# Patient Record
Sex: Male | Born: 1945 | Race: White | Hispanic: No | Marital: Married
Health system: Southern US, Community
[De-identification: ages and names within clinical notes are randomized; demographics above are authoritative.]

---

## 2007-10-13 ENCOUNTER — Ambulatory Visit: Payer: Self-pay | Admitting: Internal Medicine

## 2007-10-13 ENCOUNTER — Inpatient Hospital Stay (HOSPITAL_COMMUNITY): Admission: EM | Admit: 2007-10-13 | Discharge: 2007-10-27 | Payer: Self-pay | Admitting: Emergency Medicine

## 2007-10-14 ENCOUNTER — Ambulatory Visit: Payer: Self-pay | Admitting: Cardiology

## 2007-10-14 ENCOUNTER — Encounter: Payer: Self-pay | Admitting: Internal Medicine

## 2007-10-15 ENCOUNTER — Encounter: Payer: Self-pay | Admitting: Cardiology

## 2007-10-16 ENCOUNTER — Encounter: Payer: Self-pay | Admitting: Cardiology

## 2007-10-16 ENCOUNTER — Ambulatory Visit: Payer: Self-pay | Admitting: Cardiothoracic Surgery

## 2007-10-18 ENCOUNTER — Ambulatory Visit: Payer: Self-pay | Admitting: Dentistry

## 2007-10-18 ENCOUNTER — Encounter: Payer: Self-pay | Admitting: Cardiothoracic Surgery

## 2007-10-28 ENCOUNTER — Ambulatory Visit: Payer: Self-pay | Admitting: Cardiology

## 2007-11-02 ENCOUNTER — Ambulatory Visit: Payer: Self-pay | Admitting: Internal Medicine

## 2007-11-02 LAB — CONVERTED CEMR LAB: Prothrombin Time: 102.1 s (ref 10.9–13.3)

## 2007-11-03 ENCOUNTER — Ambulatory Visit: Payer: Self-pay | Admitting: Internal Medicine

## 2007-11-03 LAB — CONVERTED CEMR LAB
INR: 4.7 — ABNORMAL HIGH (ref 0.8–1.0)
Prothrombin Time: 47 s — ABNORMAL HIGH (ref 10.9–13.3)

## 2007-11-08 ENCOUNTER — Ambulatory Visit: Payer: Self-pay | Admitting: Internal Medicine

## 2007-11-09 ENCOUNTER — Ambulatory Visit: Payer: Self-pay | Admitting: Internal Medicine

## 2007-11-15 ENCOUNTER — Ambulatory Visit: Payer: Self-pay | Admitting: Internal Medicine

## 2007-11-18 ENCOUNTER — Ambulatory Visit: Payer: Self-pay | Admitting: Cardiothoracic Surgery

## 2007-11-18 ENCOUNTER — Encounter: Admission: RE | Admit: 2007-11-18 | Discharge: 2007-11-18 | Payer: Self-pay | Admitting: Cardiothoracic Surgery

## 2007-11-22 ENCOUNTER — Ambulatory Visit: Payer: Self-pay | Admitting: Cardiovascular Disease

## 2007-11-22 ENCOUNTER — Ambulatory Visit: Payer: Self-pay | Admitting: Cardiology

## 2007-11-30 ENCOUNTER — Ambulatory Visit: Payer: Self-pay | Admitting: Cardiovascular Disease

## 2007-12-02 ENCOUNTER — Encounter (INDEPENDENT_AMBULATORY_CARE_PROVIDER_SITE_OTHER): Payer: Self-pay | Admitting: *Deleted

## 2007-12-07 ENCOUNTER — Ambulatory Visit: Payer: Self-pay | Admitting: Family Medicine

## 2007-12-07 DIAGNOSIS — I10 Essential (primary) hypertension: Secondary | ICD-10-CM | POA: Insufficient documentation

## 2007-12-07 DIAGNOSIS — I2510 Atherosclerotic heart disease of native coronary artery without angina pectoris: Secondary | ICD-10-CM | POA: Insufficient documentation

## 2007-12-07 DIAGNOSIS — E785 Hyperlipidemia, unspecified: Secondary | ICD-10-CM

## 2007-12-08 ENCOUNTER — Ambulatory Visit: Payer: Self-pay | Admitting: Cardiology

## 2007-12-15 ENCOUNTER — Ambulatory Visit: Payer: Self-pay | Admitting: Cardiology

## 2007-12-16 ENCOUNTER — Ambulatory Visit: Payer: Self-pay | Admitting: Cardiology

## 2007-12-16 LAB — CONVERTED CEMR LAB
ALT: 37 units/L (ref 0–53)
AST: 27 units/L (ref 0–37)
AST: 27 units/L (ref 0–37)
AST: 27 units/L (ref 0–37)
Alkaline Phosphatase: 72 units/L (ref 39–117)
BUN: 20 mg/dL (ref 6–23)
BUN: 20 mg/dL (ref 6–23)
Bilirubin, Direct: 0.1 mg/dL (ref 0.0–0.3)
Calcium: 9.3 mg/dL (ref 8.4–10.5)
Calcium: 9.3 mg/dL (ref 8.4–10.5)
Chloride: 103 meq/L (ref 96–112)
Chloride: 103 meq/L (ref 96–112)
Creatinine, Ser: 1.1 mg/dL (ref 0.4–1.5)
Creatinine, Ser: 1.1 mg/dL (ref 0.4–1.5)
Creatinine, Ser: 1.1 mg/dL (ref 0.4–1.5)
GFR calc Af Amer: 87 mL/min
GFR calc Af Amer: 87 mL/min
GFR calc Af Amer: 87 mL/min
GFR calc non Af Amer: 72 mL/min
GFR calc non Af Amer: 72 mL/min
Glucose, Bld: 86 mg/dL (ref 70–99)
Glucose, Bld: 86 mg/dL (ref 70–99)
HDL: 48.8 mg/dL (ref >39.0)
HDL: 48.8 mg/dL (ref >39.0)
Potassium: 3.8 meq/L (ref 3.5–5.1)
Sodium: 139 meq/L (ref 135–145)
Total Bilirubin: 0.6 mg/dL (ref 0.3–1.2)
Total Bilirubin: 0.6 mg/dL (ref 0.3–1.2)
Total CHOL/HDL Ratio: 3.6
Total Protein: 7 g/dL (ref 6.0–8.3)

## 2007-12-21 ENCOUNTER — Ambulatory Visit: Payer: Self-pay | Admitting: Cardiovascular Disease

## 2007-12-29 ENCOUNTER — Ambulatory Visit: Payer: Self-pay | Admitting: Cardiology

## 2007-12-29 LAB — CONVERTED CEMR LAB
CO2: 30 meq/L (ref 19–32)
Calcium: 9.6 mg/dL (ref 8.4–10.5)
Chloride: 103 meq/L (ref 96–112)
Potassium: 4 meq/L (ref 3.5–5.1)

## 2007-12-30 ENCOUNTER — Ambulatory Visit: Payer: Self-pay | Admitting: Internal Medicine

## 2008-01-10 ENCOUNTER — Ambulatory Visit: Payer: Self-pay | Admitting: Internal Medicine

## 2008-01-13 ENCOUNTER — Ambulatory Visit: Payer: Self-pay | Admitting: Cardiothoracic Surgery

## 2008-06-26 ENCOUNTER — Encounter: Payer: Self-pay | Admitting: *Deleted

## 2008-08-01 ENCOUNTER — Encounter: Payer: Self-pay | Admitting: *Deleted

## 2008-09-04 ENCOUNTER — Encounter: Payer: Self-pay | Admitting: Cardiology

## 2009-03-15 ENCOUNTER — Encounter: Admission: RE | Admit: 2009-03-15 | Discharge: 2009-03-15 | Payer: Self-pay | Admitting: Family Medicine

## 2009-06-20 ENCOUNTER — Ambulatory Visit (HOSPITAL_COMMUNITY): Admission: RE | Admit: 2009-06-20 | Discharge: 2009-06-21 | Payer: Self-pay | Admitting: Cardiovascular Disease

## 2009-09-16 IMAGING — CR DG CHEST 1V PORT
1 series · 1 of 1 positions shown · non-contrast
Comparison: 10/15/2007 study.

CLINICAL DATA: History given of mitral valvular replacement
procedure.

PORTABLE CHEST - 1 VIEW

[view not recorded]
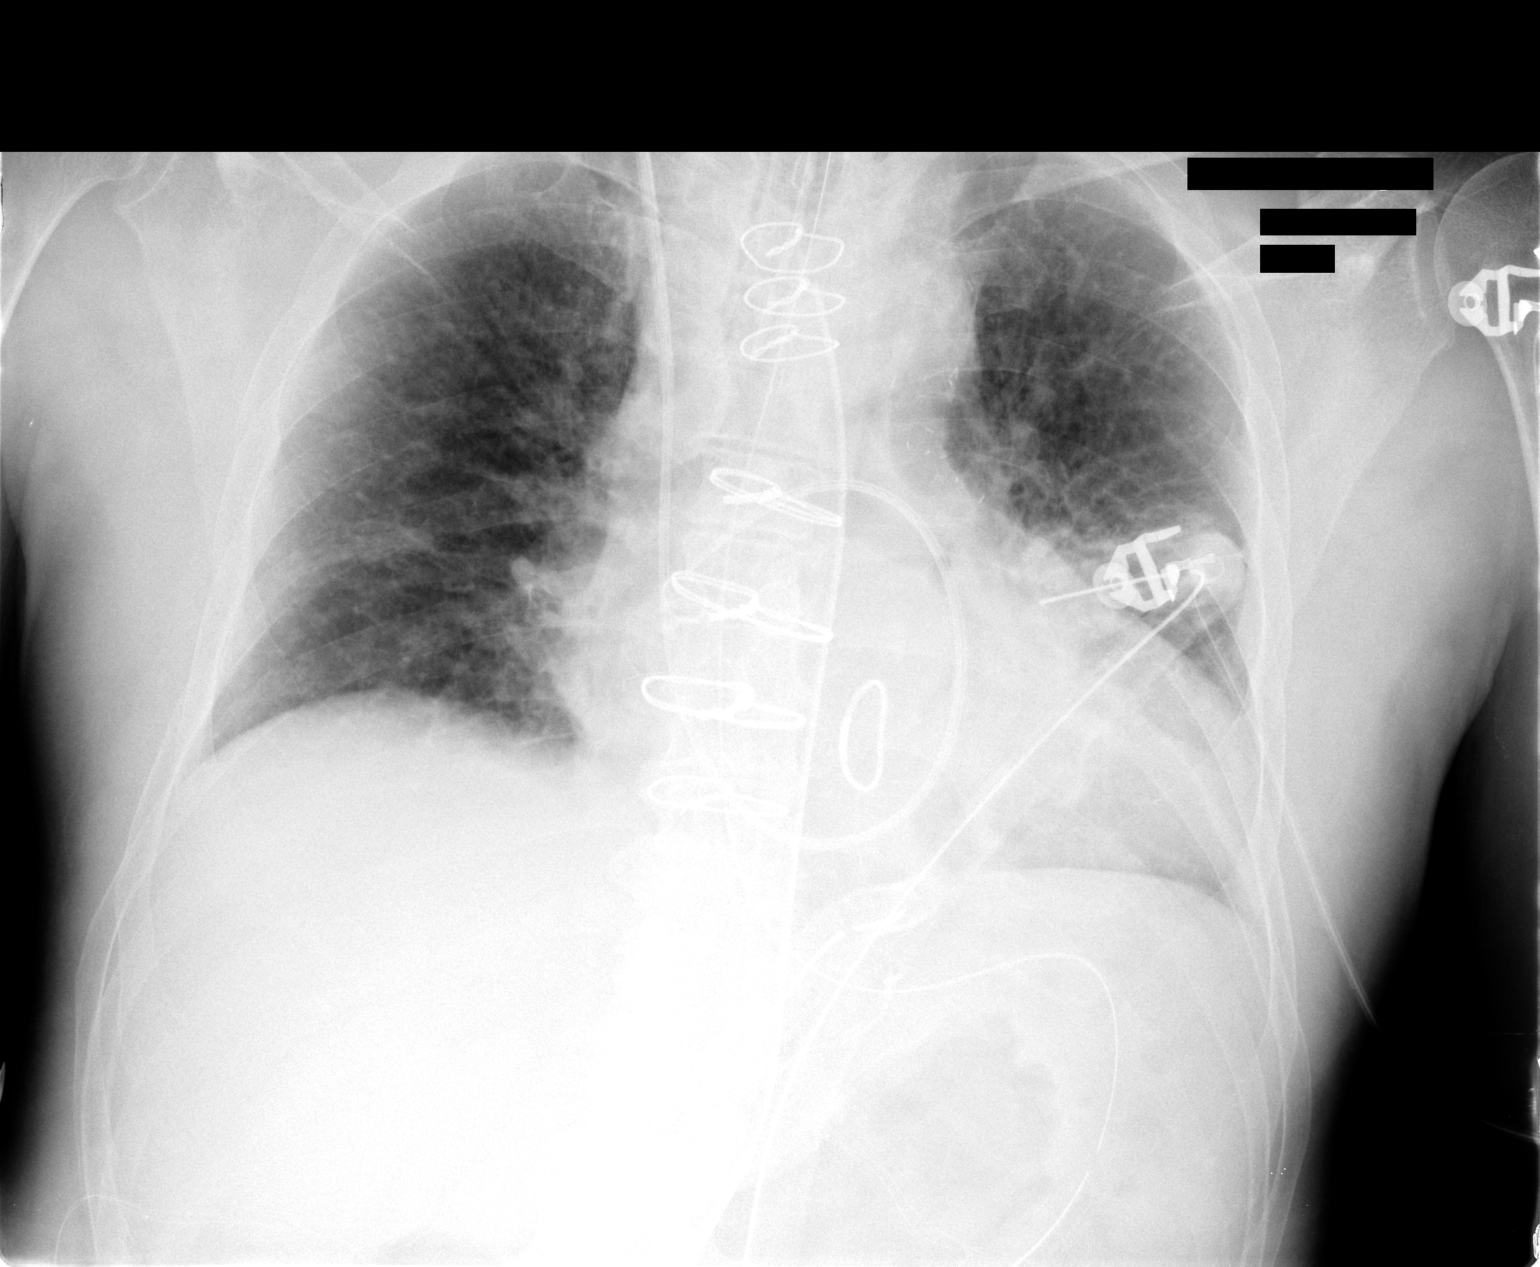

[1 of 1 positions shown; findings below may reference images not displayed]

FINDINGS: Median sternotomy and mitral valvular replacement
procedures have been performed.  Enteric tube is in place with
distal portion in the body of the stomach area but tip is not
included on the image.  Swan-Ganz catheter is in place entering
from right jugular approach.  Its tip is in the right descending
pulmonary artery.  Two left chest tubes are in place .  No
pneumothorax is evident.  There is minimal basilar atelectasis on
the right.  There is elevation of the right hemidiaphragm.
Endotracheal tube is in place with its tip 3 cm above the carina.
Mediastinal drain is in place.
IMPRESSION: Catheter , drain, and tube placements are detailed above. Moderate
enlargement cardiac silhouette.  Status post mitral valvular
replacement procedure. Elevation right hemidiaphragm with minimal
atelectasis in right lung base.

## 2009-09-17 IMAGING — CR DG CHEST 1V PORT
1 series · 1 of 1 positions shown · non-contrast
Comparison: Chest radiograph 10/20/2007

CLINICAL DATA: CABG

PORTABLE CHEST - 1 VIEW

[view not recorded]
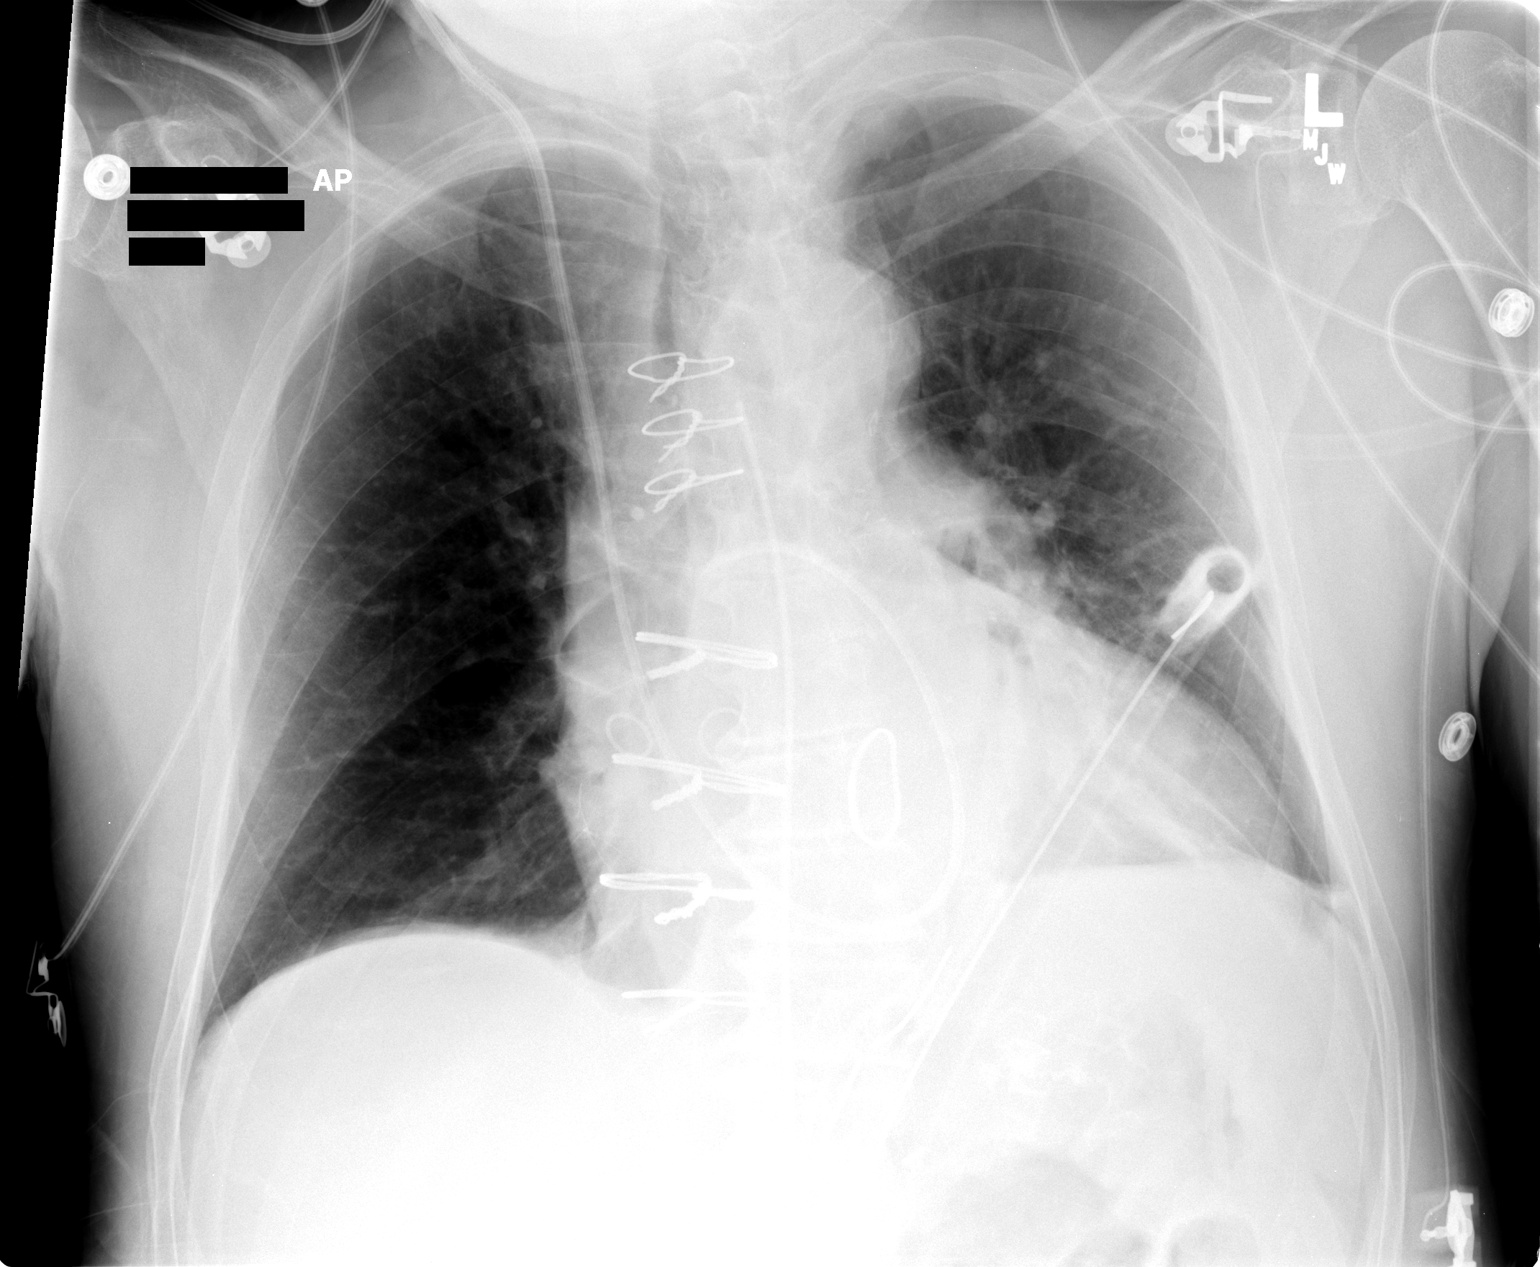

[1 of 1 positions shown; findings below may reference images not displayed]

FINDINGS: Interval extubation with no increase in atelectasis.  The
Swan-Ganz catheter, mediastinal drain, and left chest tube
unchanged.  Plate-like atelectasis at the left lung base unchanged.
There is interval improvement in interstitial edema compared to
prior.
IMPRESSION: 1.  Interval extubation with no increased atelectasis.
2.  Stable support apparatus.
3.  Improved interstitial edema.

## 2009-09-19 IMAGING — CR DG CHEST 2V
2 series · 2 of 2 positions shown · non-contrast
Comparison: 10/22/2007 portable examination.

CLINICAL DATA: Postop CABG.

CHEST - 2 VIEW

[w chest pa]
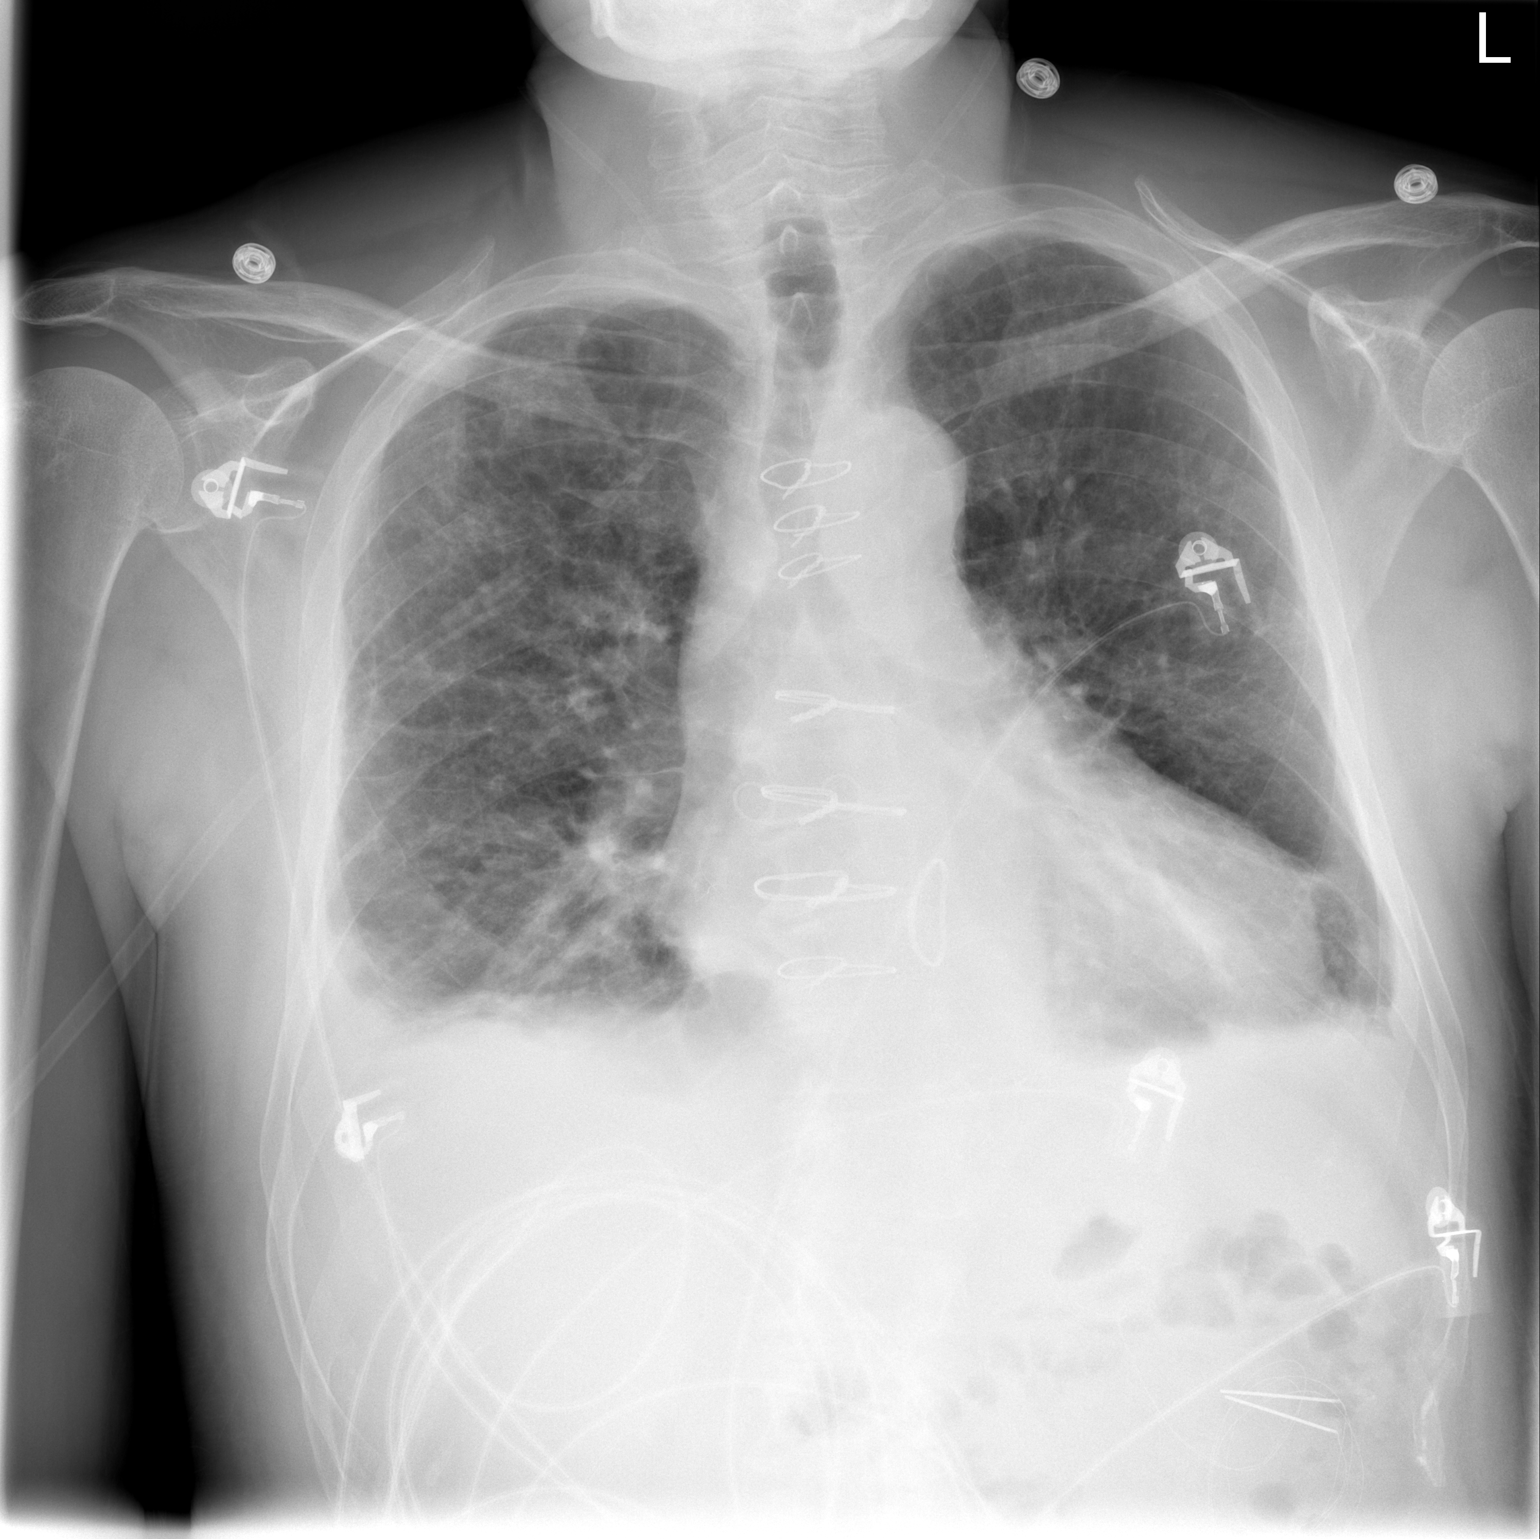

[w chest lat]
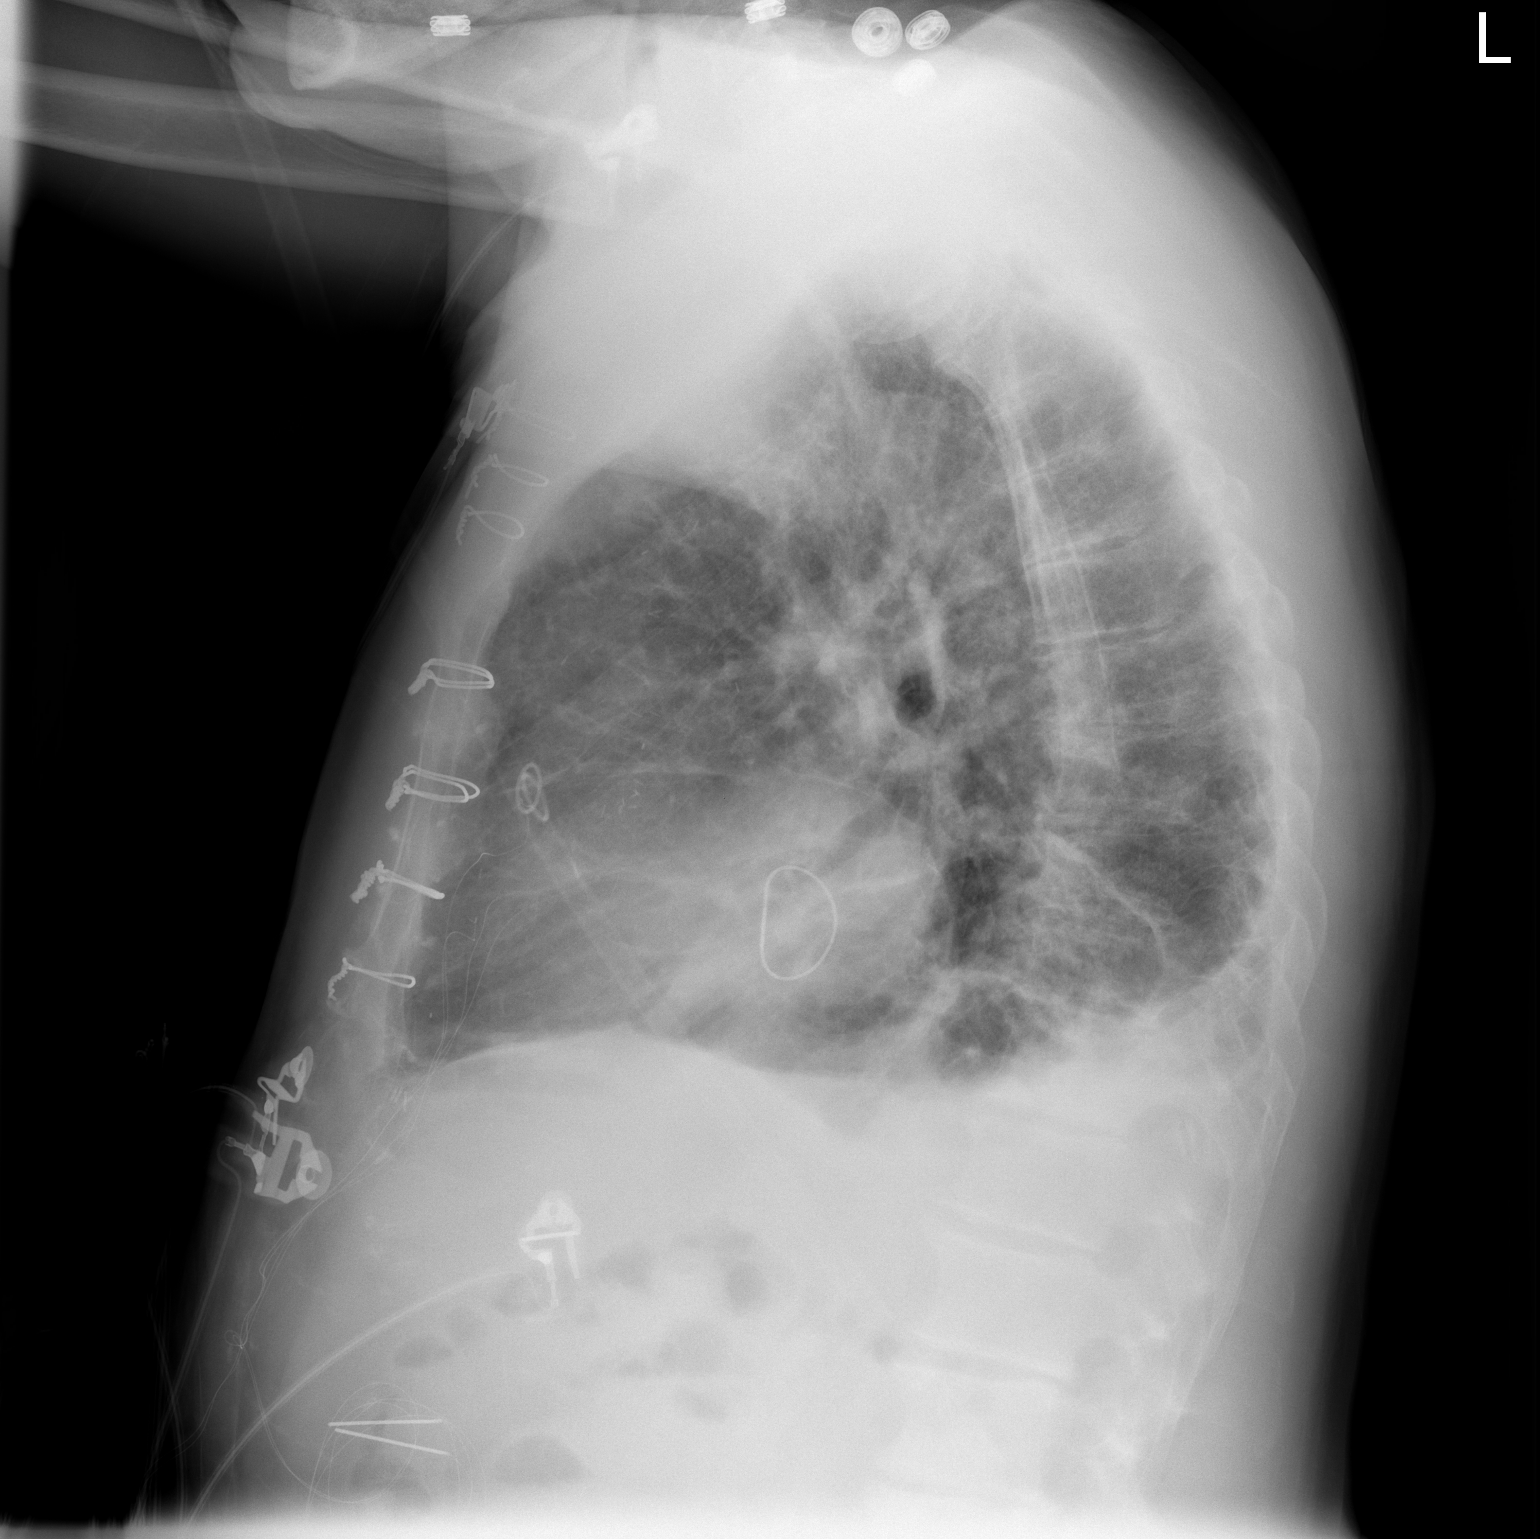

[2 of 2 positions shown; findings below may reference images not displayed]

FINDINGS: Right IJ sheath has been removed in the interval.  There
are persistent small bilateral pleural effusions with associated
bibasilar atelectasis.  The left lower lobe aeration has improved
compared with the prior examination.  There is no pneumothorax.
The heart size and mediastinal contours appear stable.
IMPRESSION: Small bilateral pleural effusions and mild bibasilar atelectasis.
Interval improved aeration of the left lung base.

## 2010-02-16 ENCOUNTER — Encounter: Payer: Self-pay | Admitting: Family Medicine

## 2010-04-14 LAB — CBC
HCT: 40.4 % (ref 39.0–52.0)
MCHC: 34.8 g/dL (ref 30.0–36.0)
MCV: 87.9 fL (ref 78.0–100.0)
Platelets: 179 10*3/uL (ref 150–400)

## 2010-04-14 LAB — COMPREHENSIVE METABOLIC PANEL
Alkaline Phosphatase: 68 U/L (ref 39–117)
Chloride: 104 mEq/L (ref 96–112)
GFR calc Af Amer: >60 mL/min (ref >60)
GFR calc non Af Amer: >60 mL/min (ref >60)
Glucose, Bld: 174 mg/dL — ABNORMAL HIGH (ref 70–99)
Potassium: 3.3 mEq/L — ABNORMAL LOW (ref 3.5–5.1)
Sodium: 139 mEq/L (ref 135–145)
Total Bilirubin: 0.9 mg/dL (ref 0.3–1.2)
Total Protein: 6.6 g/dL (ref 6.0–8.3)

## 2010-06-10 NOTE — Cardiovascular Report (Signed)
NAMEADARIAN, BUR NO.:  [id number]   MEDICAL RECORD NO.:  [id number]          PATIENT TYPE:  INP   LOCATION:  2022                         FACILITY:  [facility]   PHYSICIAN:  Arturo Morton. Riley Kill, MD, FACCDATE OF BIRTH:  1946-01-01   DATE OF PROCEDURE:  10/14/2007  DATE OF DISCHARGE:  10/27/2007                            CARDIAC CATHETERIZATION   INDICATIONS:  Mr. Poplaski is a 65 year old gentleman who presented with  positive enzymes, rapid atrial fibrillation of new onset.  He was seen  by Dr. Ladona Ridgel.  He also has a history of angina.  Dr. Ladona Ridgel evaluated  the patient and recommended cardiac catheterization.  He was brought to  the catheterization laboratory where informed consent was obtained.   PROCEDURES:  1. Left heart catheterization.  2. Selective coronary arteriography.  3. Selective left ventriculography.  4. Distal aortography.   DESCRIPTION OF PROCEDURE:  The procedure was performed for the femoral  artery without complications.  There was elevated blood pressure in the  procedure, and he was given medications to try to control this.   HEMODYNAMIC DATA:  1. Central aortic pressure was 178/89 with a mean of 125.  2. Left ventricular pressure was 158/9 and then 180/15.  There was not      a significant gradient on pullback across the aortic valve.   ANGIOGRAPHIC DATA:  1. The left main ostium was a large ostium that is without critical      narrowing.  This trifurcates into a large ramus intermedius, an      LAD, and a native circumflex vessel.  2. The left anterior descending artery courses to the apex.  The LAD      has some diffuse luminal irregularities.  In the RAO cranial views,      there appears to be probably about 50-60% proximal narrowing of the      LAD.  The remainder of the LAD demonstrate some diffuse luminal      irregularities.  3. The ramus intermedius bifurcates proximally.  There is a superior      branch and an inferior  branch.  The inferior branch has only mild      luminal irregularities.  It is moderately large-caliber vessel,      supplies a good portion of the anterolateral segment and does have      evidence of some proximal irregularity in the bend, but probably no      more than 40%.  The more superior branch is more segmentally and      diffusely diseased and has what appears to be about 80% narrowing      in its midportion.  The vessels, however, overlap rather      significantly.  4. The circumflex proper is totally occluded.  The distal circumflex      appears to be a fairly large-caliber territory, and fills in by      retrograde collateralization.  5. The right coronary artery is a very complex vessel.  There is      extreme tortuosity, extensive throughout the midvessel.  This  supplies the AV vessel, and then supplies a posterior descending-      type branch.  The vessel demonstrates subtotal occlusion in the mid      artery, and then in this area of tortuosity it is extensive, and      certainly not optimal for percutaneous intervention or stenting due      to extreme tortuosity.  Multiple views of this were obtained.      There is also evidence of thrombus in this artery as well.  A      ventriculography in the RAO projection reveals a moderate inferior      wall hypokinesis.  Ejection fraction would be estimated at 50%.      There is at least mild mitral regurgitation.  A distal aortography      demonstrates some diffuse atherosclerotic disease.  The renal      arteries grossly appear to be intact.   IMPRESSION:  1. Noncritical disease of the proximal left anterior descending.  2. Total occlusion of the native circumflex with large distal      circumflex distribution.  3. Highly complex tortuous lesion in the mid right coronary artery.      It is not optimal for percutaneous intervention.  4. Noncritical disease of the left anterior descending.   DISPOSITION:  The patient was  taken back to the CCU for further  evaluation.  We plan to review the films.  We will get a 2-dimensional  echocardiogram.  He did come in with unstable symptoms and rapid atrial  fibrillation.  The RCA is thrombotic, and clearly not ideal for  percutaneous intervention.  Once we have a 2-dimensional echocardiogram,  we will reconsider the options.      Arturo Morton. Riley Kill, MD, [facility]  Electronically Signed     TDS/MEDQ  D:  12/02/2007  T:  12/02/2007  Job:  [id number]

## 2010-06-10 NOTE — Procedures (Signed)
David [name], David [name] NO.:  [id number]   MEDICAL RECORD NO.:  [id number]          PATIENT TYPE:  INP   LOCATION:  2807                         FACILITY:  [facility]   PHYSICIAN:  Nanetta Batty, M.D.   DATE OF BIRTH:  1946-01-19   DATE OF PROCEDURE:  06/20/2009  DATE OF DISCHARGE:                    PERIPHERAL VASCULAR INVASIVE PROCEDURE   PROCEDURE:  Abdominal aortogram and bilateral lower extremity arterial  runoff.   HISTORY:  David [name] is a very pleasant, 65 year old, married, white,  male, father of 3, grandfather of 2 grandchildren who is a retired  Surveyor, minerals referred by Dr. Tracey Harries for peripheral vascular  evaluation.  He has a history of CAD status post bypass grafting and  right and left MAZE procedure with mitral valve ring placed by Dr. Kathlee Nations Trigt in September of 2009.  He had recent Dopplers performed at  radiology revealing ABIs in the 0.7 range, with monophasic waveforms.  He had a negative nuclear study and normal 2-D echo in our office.  He  presents now for angiography and potential endovascular therapy.   DESCRIPTION OF PROCEDURE:  The patient was brought to the second floor  [facility] in the postabsorptive  state.  He was premedicated with p.o. Valium, IV Versed and fentanyl.  His right groin was prepped and shaved in the usual sterile fashion.  Xylocaine 1% was used for local anesthesia.  A 5-French sheath was  inserted into the right femoral artery using standard Seldinger  technique.  It was difficult to access the femoral system and,  therefore, a short 5-French right Judkins catheter was used for  steerability and an 035 angled Glidewire was used to then gain access to  the central aorta.  This was then switched out for an [employer] wire.  A 5-French pigtail catheter was used for abdominal aortography with  bifemoral runoff using bolus chased digital subtraction step-table  technique.   Visipaque dye was used for the entirety of the case.  Retrograde aortic pressure was monitored during the case.   ANGIOGRAPHIC RESULTS:  1. Abdominal aorta:      a.     Renal arteries - Normal.      b.     Infrarenal abdominal aorta - Normal with fluoroscopic       calcification.  2. Left lower extremity:      a.     50% segmental proximal left common iliac artery stenosis.      b.     Total left superficial femoral artery at the origin       reconstituting in Hunter's canal by profunda femoris collaterals.       There is 3-vessel runoff.  3. Right lower extremity:      a.     80% fairly focal right external iliac artery stenosis.      b.     Total right superficial femoral artery at the origin with       reconstitution in Hunter's canal via profunda femoris collaterals       and 3-vessel runoff.   IMPRESSION:  David [name] has  a tight right external iliac which will  need to be percutaneously addressed with occluded superficial femoral  arteries bilaterally which will require femoral-popliteal bypass  grafting.   The patient received 3,000 units of heparin intravenously.  The short 5-  French sheath was exchanged over the [facility] wire for a long 6-French  bright-tipped sheath.  Pressure gradient was obtained over the proximal  right external iliac artery after the administration of 200 mcg of intra-  arterial nitroglycerin via the side-armed sheath which showed no  pullback gradient and, therefore, only the 80% stenosis was addressed.  Following this, predilatation was performed with 5-2 FoxCross and  stenting with a 10 x 4 Smart Nitinol self-expanding stent.  This was  then post dilated with a 7-2 FoxCross resulting in reduction of an 80%  stenosis to 0% residual with excellent flow.  The patient tolerated the  procedure well.  The guidewire was removed.  An ACT was checked and the  sheath was removed.  Pressure was held on the groin in order to achieve  hemostasis.  The patient  tolerated left the lab in stable condition.  He  will be hydrated overnight, discharged home in the morning and treated  with aspirin and Plavix.  Dr. Tracey Harries, his primary care physician,  in [place] was notified of these results.  Once he is discharged home,  he will get follow up Dopplers and ABIs and I will see him back.  At  that time, I will discuss with him surgical options for  revascularization.  He left the lab in stable condition.      Nanetta Batty, M.D.     JB/MEDQ  D:  06/20/2009  T:  06/20/2009  Job:  [id number]   cc:   [facility]  Tracey Harries, M.D.   Electronically Signed by Nanetta Batty M.D. on 07/10/2009 02:56:36 PM

## 2010-06-10 NOTE — Assessment & Plan Note (Signed)
[facility]                            CARDIOLOGY OFFICE NOTE   LAVONTE, PALOS                     MRN:          [id number]  DATE:12/15/2007                            DOB:          [birth year]    [name] is in today for a followup visit.  In general, he thinks, he  has been doing extremely well since the time of his surgery.  He is  slowly and gradually improved.  He denies any ongoing chest pain at the  present time.  He has been seen in primary care which we referred him  for help in management of his hypertension.  He was put on a new therapy  at that time including Zestoretic.  The hydralazine has been started as  an outpatient previously and increased.  He remains on Coumadin  anticoagulation, and the treatment plan has been for about 3 months on  Coumadin with his maze, and mitral valve repair.  His amiodarone  recently has been stopped by Dr. Donata Clay.  He seems to be tolerating  his pravastatin.   At the time of his bypass surgery, the patient underwent bypass graft  surgery x3 with an internal mammary to the LAD, saphenous vein graft to  the obtuse marginal, and saphenous vein graft to the distal right  coronary artery.  We reviewed these films in great detail today.  He had  a highly tortuous right coronary artery was probably the source of the  acute ischemia, and was a terrible vessel for revascularization.  In  addition, he had a totally occluded circumflex, and this was not ideal  for percutaneous intervention and occupied a fairly large segment in the  posterolateral wall.  He also had initial presentation with atrial  fibrillation.   He has been concerned as to whether or not his hypertension was actually  mediated by the surgery.  I have tried to convince and in fact what was  happened since that time has been that his initial presentation was with  hypertension in the emergency room.   MEDICATIONS:  1. Aspirin 81 mg  daily.  2. Coumadin as directed.  3. Hydralazine 50 mg q.8 h.  4. Pravastatin 40 mg daily.  5. Zestoretic 20/25 mg daily.   PHYSICAL EXAMINATION:  Blood pressure 160/110.  The pulses 88.  The  sternotomy is healing well.  The cardiac rhythm is regular.  There is an  S4 gallop.  No rubs are appreciated.   PLAN:  1. Continue to monitor blood pressure at home.  2. We will decrease his hydralazine one-half tablet q.8.  Check      pressures closely and bring a log, return to clinic in 2 weeks.     Arturo Morton. Riley Kill, MD, [facility]  Electronically Signed   TDS/MedQ  DD: 12/29/2007  DT: 12/30/2007  Job #: [id number]

## 2010-06-10 NOTE — Assessment & Plan Note (Signed)
OFFICE VISIT   David [name], David [name]  DOB:  Jun 03, 1945                                        January 13, 2008  CHART #:  [id number]   CURRENT PROBLEMS:  1. Status post coronary artery bypass graft x3, mitral valve      annuloplasty repair, and biatrial maze procedure October 20, 2007.  2. Hypertension.  3. History of paroxysmal atrial fibrillation, currently sinus rhythm.   CURRENT MEDICATIONS:  Aspirin 81 mg, Toprol-XL,  lisinopril/hydrochlorothiazide 20/25 mg, Coreg 6.25 mg b.i.d., Norvasc 5  mg daily, and pravastatin 40 mg a day.   PRESENT ILLNESS:  The patient presents for 22-month followup to discuss  his return to work status after surgical revascularization, mitral valve  repair, and biatrial maze procedure after his presentation with ischemic  MR and recent MI.  He has done very well and he is walking and actually  jogging now.  He has no symptoms of angina or CHF.  He has been off of  his Coumadin and amiodarone and has maintained a sinus rhythm.  He is  planning on going back to work part-time and wishes to discuss this.   PHYSICAL EXAMINATION:  VITAL SIGNS:  Blood pressure 150/88, pulse 80 and  regular, respirations 18, and saturation 98%.  LUNGS:  Breath sounds are clear.  CHEST:  Sternum is well healed.  CARDIAC:  Rhythm is regular without murmur.  EXTREMITIES:  There is no peripheral edema.   A EKG rhythm strip shows sinus rhythm.   RECOMMENDATIONS AND PLAN:  The patient is doing well.  I agree with Dr.  Riley Kill, he could return to work, but to avoid lifting extremely heavy  objects.  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  He knows the importance of good dental hygiene  and antibiotic prophylaxis before any future extractions.  He will  return as needed.   Kerin Perna, M.D.  Electronically Signed   PV/MEDQ  D:  01/13/2008  T:  01/14/2008  Job:  [id number]

## 2010-06-10 NOTE — Discharge Summary (Signed)
David [name], David [name] NO.:  [id number]   MEDICAL RECORD NO.:  [id number]           PATIENT TYPE:   LOCATION:                                 FACILITY:   PHYSICIAN:  Kerin Perna, M.D.  DATE OF BIRTH:  12-26-1945   DATE OF ADMISSION:  10/13/2007  DATE OF DISCHARGE:  10/27/2007                               DISCHARGE SUMMARY   PRIMARY ADMITTING DIAGNOSIS:  Chest pain.   ADDITIONAL/DISCHARGE DIAGNOSES:  1. Non-ST elevation myocardial infarction.  2. Coronary artery disease.  3. Atrial fibrillation.  4. Hypertension.  5. History of tobacco abuse.  6. History of diverticulosis, status post subtotal colectomy.  7. Mitral regurgitation 2+.  8. Peripheral vascular occlusive disease with diminished ABIs      bilaterally.  9. Postoperative mild renal insufficiency.   PROCEDURES PERFORMED:  1. Cardiac catheterization.  2. Coronary artery bypass grafting x3 (left internal mammary artery to      the left anterior descending, saphenous vein graft to the obtuse      marginal, and saphenous vein graft to the distal right coronary      artery).  3. Mitral valve repair with 26-mm Physio II annuloplasty ring.  4. Left atrial appendage.  5. Left to right heart Maze procedures.   HISTORY:  The patient is a 65 year old male who presented to the  emergency department on the date of this admission complaining of chest  discomfort.  He states he awoke on that morning with sudden onset of  shortness of breath, chest pressure, jaw pain, shoulder pain, and  generalized achiness.  He was seen in the emergency department at [facility] and was found to have hypertension and tachycardia with  atrial fibrillation.  He was started on a Cardizem drip with some  improvement in his heart rate, and was seen by Cardiology.  He was  subsequently transferred to [facility] for evaluation and  cardiac workup.   HOSPITAL COURSE:  David [name] was admitted on October 13, 2007.  He  ruled in for a non-ST elevation myocardial infarction.  He underwent a 2-  D echocardiogram, which showed mild-to-moderate reduction in the LV  function with inferior and apical hypokinesis and 2+ MR.  He was  chemically converted back to normal sinus rhythm and underwent  subsequent cardiac catheterization by Dr. Riley Kill, which showed severe  multivessel coronary artery disease, which was not felt to be amenable  to percutaneous intervention.  Because of these findings, a Cardiac  Surgery consult was obtained.  Dr. Kathlee Nations Trigt saw the patient and  reviewed his films, and felt that he would benefit from surgical  revascularization.  Also, after review of his echo, it was felt that he  would probably require a mitral valve repair and a Maze procedure for  his AFib.  He explained all risks, benefits, and alternatives of surgery  to the patient and David [name] agreed to proceed.  His preoperative  workup included carotid Doppler studies, which showed no significant ICA  stenosis bilaterally with decreased ABIs at 0.63 on the right and  0.65  on the left.  He was continued on IV heparin without recurrence of chest  discomfort.  He was able to be taken to the operating room on October 20, 2007, and underwent CABG x3, mitral valve repair, and Maze procedure  as described in detail above.  He tolerated the procedure well and was  transferred to the SICU in stable condition.  He was able to be  extubated shortly after surgery.  He was hemodynamically stable and  doing well on postop day #1.  At that time, his hemodynamic monitoring  devices and chest tubes were removed.  He was kept in the unit for  further observation.  By postop day #2 he was ready for transfer to the  floor.  He was initially bradycardic and required AAI pacing.  He was  also started on very low-dose beta-blocker.  By postop day #3, his pacer  was weaned and discontinued, and he was able to maintain sinus  rhythm  with rates in the 60s to 80s.  His blood pressure has gradually been  trending upward and his beta-blocker dose has been titrated accordingly.  He was also started on an ACE inhibitor, which is also currently being  titrated.  He initially experienced a mild increase in his renal  function with creatinine bumped up to 1.68.  This has trended downward  and has stabilized with creatinine at 1.4.  He has been volume  overloaded postoperatively and has been diuresed with both p.o. Lasix,  IV Lasix, and Zaroxolyn, and is responding well.  He remains  approximately 2 kg above his preoperative weight with mild lower  extremity edema on physical exam.  He has also been anticoagulated  postoperatively and presently his INR is 2.8 with a PT of 31.8.  He is  otherwise progressing well.  He is ambulating with cardiac rehab phase 1  and is making good progress with mobility.  He is tolerating a regular  diet and is having normal bowel and bladder function.  He remains on 1 L  of supplemental oxygen, but sats have been greater than 95%, and it is  anticipated that this will be weaned and discontinued over the next 24  hours.  He has been afebrile and his other vital signs have been stable.   His most recent labs show hemoglobin of 9.3, hematocrit 27.1, platelets  138, and white count 11.6.  Sodium 137, potassium 3.8, BUN 16, and  creatinine 1.4.  Coags as above.  He was anticipated that if he remains  stable over the next 24 hours, he will be ready for discharge home  hopefully on October 27, 2007.   DISCHARGE MEDICATIONS:  1. Coumadin home dose will be determined by PT and INR drawn on the      day of discharge.  2. Aspirin 81 mg daily.  3. Toprol-XL 25 mg daily.  4. Lisinopril 30 mg daily.  5. Crestor 40 mg nightly.  6. Amiodarone 200 mg b.i.d.  7. Lasix 40 mg daily x 5 days.  8. K-Dur 20 mEq daily x 5 days.  9. Tylox 1-2 q.4 h. p.r.n. for pain.   DISCHARGE INSTRUCTIONS:  He is asked  to refrain from driving, heavy  lifting, or strenuous activity.  He may continue ambulating daily and  using his incentive spirometer.  He may shower daily and clean his  incisions with soap and water.  He will continue low-fat, low-sodium  diet.   DISCHARGE FOLLOWUP:  He will  need to make an appointment see Dr. Riley Kill  in 2 weeks.  He will then follow up with Dr. Donata Clay on November 18, 2007, with a chest x-ray from [facility].  A home health nurse  will be arranged for cardiac restorative care and will draw PT and INR  within 48 hours of discharge, and his  Coumadin will be managed by the [facility].  He has been  counseled regarding smoking cessation during his hospitalization and has  been given outpatient resources as well as encouraged to use NicoDerm if  needed.  He will call our office if he experiences any problems or has  questions.      Coral Ceo, P.A.      Kerin Perna, M.D.  Electronically Signed    GC/MEDQ  D:  10/26/2007  T:  10/26/2007  Job:  [id number]   cc:   Arturo Morton. Riley Kill, MD, [facility]  [facility] Office

## 2010-06-10 NOTE — Consult Note (Signed)
David, [name] NO.:  [id number]   MEDICAL RECORD NO.:  [id number]         PATIENT TYPE:  CINP   LOCATION:                               FACILITY:  [facility]   PHYSICIAN:  David [name], D.D.S.DATE OF BIRTH:  1945-06-06   DATE OF CONSULTATION:  10/18/2007  DATE OF DISCHARGE:                                 CONSULTATION   David [name] is a 65 year old male, referred by Dr. Kathlee Nations [name]  for dental consultation.  The patient with recent identification of  severe coronary artery disease and mitral regurgitation.  The patient  was anticipated heart valve surgery with Dr. Donata [name] in the future.  The patient is now seen as part of pre heart valve surgery dental  protocol evaluation.   PAST MEDICAL HISTORY:  1. Coronary artery disease--      a.     Status post non-ST-elevation MI in September 2009.      b.     Status post cardiac catheterization on October 14, 2007,       which revealed severe coronary artery disease and ejection       fraction of approximately 40% and moderate mitral regurgitation       was also noted.      c.     Anticipated coronary artery bypass graft procedure with maze       procedure and possible mitral valve replacement with Dr. Donata [name]       in the future.  2. History of angina.  3. Atrial fibrillation.  4. Hypertension.  5. History of diverticulosis status post subtotal colectomy.  6. Status post right knee replacement.   Allergies - none known.   MEDICATIONS:  1. Aspirin 325 mg daily.  2. Crestor 40 mg every evening.  3. Nicotine patch daily.  4. Protonix 40 mg daily.  5. Toprol-XL 50 mg daily.  6. Imdur 30 mg daily.  7. Lisinopril 20 mg daily.  8. Amiodarone 400 mg twice daily.  9. Advair 1 puff twice daily.  10.Bactroban nasally daily.  11.Heparin IV per protocol.   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  The patient with a smoking history of 49-pack-years.  The patient  with  occasional use of alcohol.   FAMILY HISTORY:  Mother died of unknown causes.  Father died of cancer.   FUNCTIONAL ASSESSMENT:  The patient was independent for ADLs prior to  this admission.   REVIEW OF SYSTEMS:  This is reviewed from the chart and health issue  assessment form for this admission.   CHIEF COMPLAINT:  Dental consultation requested as part of a pre heart  valve surgery dental protocol.   HISTORY OF PRESENT ILLNESS:  The patient is with a history of coronary  artery disease and recent identification of moderate mitral  regurgitation.  The patient with anticipated coronary artery bypass  graft procedure with possible mitral valve replacement.  The patient is  now seen as part of pre heart valve surgery dental protocol evaluation.   The patient currently denies acute toothache swellings or abscesses.  The patient last saw dentist approximately in 1998 and 1999 when an  upper denture was fabricated.  This was in [place].  The patient  indicates that the upper denture fits fine.  The patient knows that he  has several loose teeth.   PHYSICAL EXAMINATION:  GENERAL:  The patient is well-developed, well-  nourished male in no acute distress.  VITAL SIGNS:  The blood pressure is 161/99, pulse rate is 67,  respirations are 18, temperature is 98.5.  NECK:  There is no palpable lymphadenopathy.  There are no acute TMJ  symptoms.   INTRAORAL EXAMINATION:  The patient is edentulous on the maxilla.  The  patient has excessive maxillary right and maxillary left tuberosities.  There is no evidence of abscess formation at this time.   DENTITION:  The patient with remaining teeth numbers 21, 22, 23, 26, 27,  28, and 29.   DENTAL CARIES:  There are dental caries noted as per dental charting  form.   PERIODONTAL:  The patient with chronic advanced periodontal disease with  plaque and calculus accumulations, generalized gingival recession, and  generalized tooth  mobility.   ENDODONTIC:  The patient currently denies acute pulpitis symptoms.  The  patient has had a previous root canal therapy associated with tooth #29.  There is possible incipient periapical pathology associated tooth #21.   CROWN OR BRIDGE:  There are no crown or bridge restorations.   PROSTHODONTIC:  The patient with upper complete denture which fits  fine by patient report.  The upper denture is actually ill fitting.   OCCLUSION:  The patient with a poor occlusal scheme secondary to  multiple missing teeth and lack of replacement of all missing teeth with  dental prostheses.   RADIOGRAPHIC INTERPRETATION:  A panoramic x-ray was taken on October 15, 2007.  The patient has edentulous maxilla.  Tooth numbers 21, 22, 23, 26, 27,  28, and 29 are remaining.  Tooth #29 is a retained root segment.  Dental  caries are noted.  There is moderate to severe bone loss.   ASSESSMENT:  1. History of oral neglect.  2. Chronic periodontitis with bone loss.  3. Generalized gingival recession.  4. Generalized tooth mobility.  5. Multiple missing teeth.  6. Dental caries.  7. Retained root segment #29.  8. Significant tooth mobility remaining.  9. Incipient periapical pathology associated tooth #21.  10.Risk for bleeding with invasive dental procedures due to heparin      therapy.  11.Probable need for antibiotic premedication prior to invasive dental      procedures.  12.Upper complete denture which is less than ideal.   PLAN/RECOMMENDATION:  1. I have discussed risks, benefits, complication, various treatment      options with the patient in relationship to his medical and dental      conditions and anticipated heart and heart valve surgeries.  We      discussed various treatment options to include no treatment, total      and subtotal extractions with alveoloplasty, preprosthetic surgery      as indicated, periodontal therapy, dental restorations, root canal      therapy, crown  or bridge therapy, implant therapy, and replacing      missing teeth as indicated after adequate healing.  The patient      currently wishes to proceed with extraction of all remaining teeth      with alveoloplasty and preprosthetic surgery of the bilateral      tuberosities.  This will be in operating room on Thursday,      October 20, 2007, at 1:30 p.m.  Heparin will be discontinued      prior to invasive dental procedures and antibiotic premedication      will be given.  2. Discussion of findings and coordination of care with Dr. Kathlee Nations      [name] and Dr. Shawnie Pons as indicated.   ADDENDUM: David [name] changed his mind concerning dental treqtment  and admantly refused to proceed with dental treatment before anticipated  heart surgery. Pateint was made awareo fthe riks assocaited with this  decision and will follow-up with the Dentist of his choice for dental  treatment once medically stable.      David [name], D.D.S.  Electronically Signed     RFK/MEDQ  D:  10/18/2007  T:  10/19/2007  Job:  [id number]   cc:   Kerin Perna, M.D.  Arturo Morton. Riley Kill, MD, [facility]

## 2010-06-10 NOTE — Op Note (Signed)
NAMETRIG, MCBRYAR NO.:  [id number]   MEDICAL RECORD NO.:  [id number]          PATIENT TYPE:  INP   LOCATION:  2307                         FACILITY:  [facility]   PHYSICIAN:  Kerin Perna, M.D.  DATE OF BIRTH:  [birth year]   DATE OF PROCEDURE:  10/20/2007  DATE OF DISCHARGE:                               OPERATIVE REPORT   OPERATION:  1. Coronary artery bypass grafting x3 (left internal mammary artery to      left anterior descending, saphenous vein graft to obtuse marginal,      and saphenous vein graft to distal right coronary artery).  2. Left and right atrial Maze procedure.  3. Oversewing of left atrial appendage.  4. Mitral valve repair with a 26-mm Physio-II annuloplasty ring.   SURGEON:  Kerin Perna, MD   ASSISTANT:  Coral Ceo, PA-C   ANESTHESIA:  General.   PREOPERATIVE DIAGNOSIS:  Severe multivessel coronary artery disease with  2+ mitral regurgitation and paroxysmal atrial fibrillation with recent  subendocardial myocardial infarction.   POSTOPERATIVE DIAGNOSIS:  Severe multivessel coronary artery disease  with 2+ mitral regurgitation and paroxysmal atrial fibrillation with  recent subendocardial myocardial infarction.   INDICATIONS:  The patient is a 65 year old gentleman who presented with  paroxysmal atrial fibrillation, chest pain, and positive cardiac  enzymes.  A 2-D echo showed mild-to-moderate reduction in LV function  with inferior and apical hypokinesia and 2+ mitral regurgitation.  Subsequent cardiac catheterization after the patient was chemically  cardioverted back to a regular rhythm showed severe multivessel coronary  artery disease with a 95% stenosis of the right coronary artery, chronic  occlusion of the circumflex, and a 50-60% stenosis of the origin of the  LAD.  LVEF was 50.  He was felt to be a candidate for surgical  revascularization, Maze procedure, and possible mitral valve  annuloplasty repair.  Prior to  surgery, I examined the patient several  times in his CCU room as well as his step-down telemetry room.  I  reviewed the results of the cardiac cath and echo with the patient and  family.  I discussed the indications and expected benefits of coronary  artery bypass surgery, Maze procedure, and mitral valve annuloplasty  repair for treatment of his coronary artery disease, paroxysmal atrial  fibrillation, and mitral regurgitation.  I reviewed the alternatives to  surgical therapy.  I discussed with him the major aspects of the surgery  including location of the surgical incisions, use of general anesthesia  and cardiopulmonary bypass, the plan to use the internal mammary artery  and endoscopically harvested saphenous vein for conduit, and the  expected postoperative hospital recovery.  He also understood the  associated risks of the surgery including the risks of MI, stroke,  bleeding, blood transfusion requirement, infection, pacemaker  requirement, and death.  After discussing these issues, he demonstrated  his understanding and agreed to proceed with surgery as planned under  what I felt was an informed consent.   OPERATIVE FINDINGS:  1. Severe multivessel coronary artery disease with calcified plaque,      mild inferior scar.  2. Mitral valve was structurally intact but with the annular      dilatation and was treated for 2 to 3+ mitral regurgitation is seen      on intraoperative TEE with an annuloplasty ring with complete      resolution of the mitral regurgitation.  3. Left and right atrial ablation lines were performed according to      the Cox - Maze guidelines and the atrial appendage was oversewn.   PROCEDURE:  The patient was brought to the operative room and placed  supine on the operating table.  General anesthesia was induced.  The  transesophageal 2-D echo was placed by the anesthesiologist which  initially showed mild mitral regurgitation.  However, after the   sternotomy had been completed and the mammary was being harvested, the  patient's blood pressure was elevated to 140 and at that time, the  mitral regurgitation became significant and it was decided to proceed  with an annuloplasty repair.  The patient was prepped and draped as a  sterile field, and a sternal incision was made as the saphenous vein was  harvested endoscopically from the right leg.  The internal mammary  artery was harvested as a pedicle graft from its origin at the  subclavian vessels and was a good vessel with adequate flow.  Sternal  retractor was placed, and heparin was administered.  The ACT was  documented as being therapeutic.  Pursestrings were placed in the  ascending aorta and right atrium and after the vein had been harvested  and inspected and found be adequate, the patient was cannulated and  placed on bypass.  A second pursestring was placed low on the right  atrium for bicaval venous drainage.  The interatrial groove was  dissected and caval tapes were placed around the SVC and IVC.  The  coronaries were identified for grafting, and a cardioplegic catheter was  placed in the ascending aorta connected to the vent.  The patient was  cooled to 30 degrees.  The mammary artery and vein grafts were prepared  for the distal anastomoses and cross-clamp was applied.  An 800 mL of  cold blood cardioplegia was delivered through the aortic root with good  cardioplegic arrest and septal temperature dropped less than 15 degrees.  Cardioplegia was then delivered every 20 minutes or less while the cross-  clamp was applied.   The Maze procedure was then performed partially at this point.  The  bipolar radiofrequency ablation clamp was placed across the atrial  appendage to create an ablation line.  Next, the ligament of Gaynell Face  between the left superior pulmonary vein and the left pulmonary artery  was opened and the left-sided pulmonary veins were encircled with vessel   loop.  The bipolar radiofrequency clamp was then placed around the cuff  of the left pulmonary veins at the junction of the left atrium and an  ablation line was created.   The distal coronaries were then performed after a redose of  cardioplegia.  The first distal anastomosis was to the circumflex branch  of the left coronary.  This was totally occluded.  It was a 1.5 mm  vessel.  A reverse saphenous vein was sewn end-to-side with a running 7-  0 Prolene with good flow through the graft.  The second distal  anastomosis was to the distal right.  This was a smaller 1.2-mm vessel  with proximal 95% stenosis.  A reverse saphenous vein was sewn end-to-  side with  a running 7-0 Prolene with good flow through the graft.  Cardioplegia was redosed.  The third distal anastomosis was to the  distal third of the LAD.  The LAD had some diffuse plaque and a proximal  ostial 50% stenosis.  The left IMA pedicle was brought through an  opening created in the left lateral pericardium, was brought down onto  the LAD and sewn end-to-side with a running 8-0 Prolene.  There was  excellent flow through the anastomosis after briefly releasing the  pedicle bulldog on the mammary artery.  The bulldog was reapplied and  the pedicle was secured to epicardium.  Cardioplegia was redosed.   A left atriotomy incision was then made in the interatrial groove.  The  atrial retractors were positioned.  Exposure of the atrium was good.  Next, the bipolar radiofrequency device was then used to create an  ablation line posteriorly around the pulmonary veins on the right side  at the junction of the left atrium.  This was placed by using the  bipolar radiofrequency ablation clamp from below and as well as above.  This completed the encircling lesion around the right-sided veins.  Next, the bipolar device was used to make an ablation line connecting  the right-sided encircling ablation line to the left-sided ablation line   across the back of the left atrial wall.  A  final bipolar ablation line  was then created from the atrial incision up to the mitral valve at the  P3 segment.  Finally, the unipolar ablation device - pen was used to  connect the ablation line around the atrial appendage down to the left-  sided pulmonary vein ablation line.  After this, the atrial appendage  was oversewn with a running 3-0 Prolene from the inside.  This completed  the Maze procedure on the left atrium.  Cardioplegia was redosed.   Next, attention was directed to the mitral valve.  It was tested with  saline and there is a central regurgitant jet.  Annuloplasty sutures of  2-0 Ethibond were placed around the annulus numbering 14 total.  The  anterior leaflet was sized to a 26-mm Physio-II ring.  The sutures were  placed through the Physio-II ring (serial number [id number], model 5200)  and the ring was seated and the sutures were tied.  The valve was then  tested and was completely competent.  The left atriotomy incision was  then closed using a running 4-0 Prolene and a Foley catheter was left  through the valve into the left ventricle to serve as a vent for removal  of air later.  Cardioplegia was redosed.   The right atrium was then opened with a vertical incision across the  crista terminalis.  The Maze ablation lines were then created using the  bipolar clamp from this incision superiorly towards the SVC and from the  incision inferiorly towards the IVC.  Next, a bipolar incision was made  from the superior aspect of the atrial incision down to the tricuspid  valve.  Next, an unipolar ablation line was created from the inferior  IVC ablation line up to the coronary sinus.  This completed the right-  sided Maze ablation procedure.  The atriotomy was then closed with a  running 4-0 Prolene in 2 layers.  Cardioplegia was redosed.   While the cross-clamp was still applied, 2 proximal vein anastomoses  were performed on the  ascending aorta using a 4.5-mm punch in running 7-  0 Prolene.  Best boy  was vented from the coronaries with usual de-airing  maneuvers before the final proximal anastomosis was tied.  The cross-  clamp was then removed.   The heart was cardioverted back to a regular rhythm.  The mammary artery  was opened and the vein grafts were opened and each had good flow and  hemostasis was documented to the proximal distal sites.  The patient was  rewarmed and reperfused.  Temporary pacing wires were applied.  The  atriotomy incisions were completed and were hemostatic.  When the  patient had been adequately rewarmed and reperfused, the ventilator was  resumed.  The patient was then weaned from bypass on a low-dose dopamine  and milrinone.  Cardiac output and blood pressure were stable.  Protamine was administered without adverse reaction.  The TEE showed the  mitral valve to be competent and global LV function was well preserved.  The patient was in a sinus rhythm.  Protamine was administered without  adverse reactions.  The cannulas were removed.  The mediastinum was  irrigated with warm saline.  The superior pericardial fat was closed.  The platelet count came back well low at 90,000.  The patient was given  platelets and FFP with improved coagulation function.  Two mediastinal  and a left pleural chest tube were placed and brought out through  separate incisions.  The sternum was closed with interrupted steel wire.  The pectoralis fascia was closed with a running #1 Vicryl.  The  subcutaneous and skin layers were closed in running Vicryl and sterile  dressings were applied.  Total bypass time was 224 minutes.      Kerin Perna, M.D.  Electronically Signed     PV/MEDQ  D:  10/20/2007  T:  10/21/2007  Job:  [id number]   cc:   Arturo Morton. Riley Kill, MD, [facility]

## 2010-06-10 NOTE — Assessment & Plan Note (Signed)
OFFICE VISIT   David [name], David [name]  DOB:  11/04/1945                                        November 18, 2007  CHART #:  [id number]   CURRENT PROBLEMS:  1. Status post coronary artery bypass graft x3, mitral valve      annuloplasty repair, and left and right atrial maze procedure on      10/20/2007.  2. Hypertension.  3. History of paroxysmal atrial fibrillation, now sinus rhythm post      coronary artery bypass graft, mitral valve repair, and maze      procedure.   CURRENT MEDICATIONS:  Amiodarone 200 mg b.i.d., Coumadin as directed per  [facility], aspirin 81 mg a day, Toprol-XL 25 mg,  hydrochlorothiazide 25 mg daily, Zocor 40 mg daily, lisinopril 20 mg  b.i.d., and the vitamins.   PRESENT ILLNESS:  The patient returns for his first office visit after  undergoing cardiac surgery on 10/18/2007.  He is doing well without  recurrent symptoms of angina or CHF.  He has remained in sinus rhythm.  The surgical incisions are healing well.  He has had no bleeding  complications from his Coumadin therapy initiated after his diagnosis of  atrial fibrillation on admission.  His weight has been stable and he is  walking daily.  He was recently checked at the [facility] where he  was hypertensive and he was started on hydrochlorothiazide.   PHYSICAL EXAMINATION:  VITAL SIGNS:  Blood pressure 160/98, pulse 74,  respirations 18, and saturation 97%.  GENERAL:  He is alert and pleasant.  LUNGS:  Breath sounds are clear and equal.  The sternum is stable.  CARDIAC:  Rhythm is regular.  There is no murmur, rub, or gallop.  ABDOMEN:  Soft without edema.  EXTREMITIES:  The leg incision is healing well.  There is no peripheral  ankle edema.   A PA and lateral chest x-ray reveals stable cardiac silhouette, slight  blunting of left costophrenic angle with no significant pleural  effusion, and changes of COPD from his long history of smoking.  There  is no  evidence of edema or infiltrate.   RECOMMENDATIONS:  The patient will increase his Toprol-XL to 50 mg once  a day due to his persistent hypertension.  He will reduce the amiodarone  to once a day.  He will continue the Coumadin for probably at least 3  months, although leave this decision to Dr. Riley Kill.  He knows he can  resume driving light activities, but to avoid lifting more than 20  pounds until 3 months after surgery.  He will proceed with outpatient  cardiac rehabilitation next week as scheduled.   No prescriptions were provided this office visit.  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   Kerin Perna, M.D.  Electronically Signed   PV/MEDQ  D:  11/18/2007  T:  11/18/2007  Job:  [id number]   cc:   Arturo Morton. Riley Kill, MD, [facility]

## 2010-06-10 NOTE — Assessment & Plan Note (Signed)
[facility]                            CARDIOLOGY OFFICE NOTE   David [name], David [name]                       MRN:          [id number]  DATE:11/09/2007                            DOB:          May 21, 1945    PRIMARY CARDIOLOGIST:  Arturo Morton. Riley Kill, MD, [facility]   [name] is a 65 year old gentleman who presented to [facility]  with a non-ST-elevation MI.  He was found to have three-vessel coronary  artery disease in addition to 2+ MR and atrial fibrillation.  He  underwent CABG x3 on October 20, 2007, with a LIMA to the LAD, SVG to  the OM, and SVG to the distal RCA.  He also had left and right atrial  maze procedure, oversewing of the left atrial appendage, and mitral  valve repair with a 26-mm Physio 2 annuloplasty ring by Dr. Donata Clay.  He was placed on Coumadin and amiodarone for postop atrial fibrillation  as well.   Since the patient has been home, he is recovering quite well.  His main  complaint is fatigue.  He is able to walk 5 minutes, sometimes 3 times a  day, but other days just once a day.  He is not willing to joint cardiac  rehab at this time.  He denies any dyspnea on exertion, chest pain,  dizziness, or presyncope.  He is quite concerned because his blood  pressure is elevated more than usual at home, but his wife states that  he is very tense and has had actually 3 blow ups since he has been  home.  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  He has quit smoking.  He is currently wearing a nicotine patch and I  congratulated him for this.  I spent at least 30 minutes speaking to  them about the different medications he is on and the procedures he went  through and encouraged him to enroll in cardiac rehab to get him back on  his feet sooner than later.   CURRENT MEDICATIONS:  1. Aspirin 81 mg daily.  2. Coumadin as directed.  3.  Toprol-XL 25 mg daily.  4. Lisinopril 30 mg daily.  5. Crestor over 40 mg nightly.  6. Amiodarone 200 mg t.i.d.   PHYSICAL EXAMINATION:  GENERAL:  This is a pleasant 65 year old white  male in no acute distress.  VITAL SIGNS:  Blood pressure 177/99, pulse 69, weight 162.  NECK:  Without JVD, HJR, bruit, or thyroid enlargement.  LUNGS:  Decreased breath sounds, but they are clear anterior, posterior,  and lateral.  HEART:  Regular rate and rhythm at 70 beats per minute, normal S1 and  S2, no murmur, rub, bruit, thrill, or heave appreciated.  ABDOMEN:  Soft without organomegaly, masses, lesions, or abnormal  tenderness.  EXTREMITIES:  Without cyanosis, clubbing, or edema.  He has good distal  pulses.  All incisions are healing well.   EKG, normal sinus rhythm, inferior Q waves.   IMPRESSION:  1. Coronary artery disease status post coronary artery bypass grafting      x3 on October 20, 2007, with a left internal mammary artery to      the left anterior descending, saphenous vein graft to the obtuse      marginal, saphenous vein graft to the distal right coronary artery.  2. Left and right atrial maze procedure.  3. Oversewing of left atrial appendage.  4. Mitral valve repair with a 26-mm Physio 2 annuloplasty ring.  5. Status post non-ST elevation myocardial infarction on October 13, 2007.  6. Atrial fibrillation, currently in normal sinus rhythm.  7. History of tobacco abuse, has quit.  8. Hypertension.  9. History of diverticulosis status post subtotal colectomy.  10.Peripheral vascular occlusive disease with diminished ABIs      bilaterally.  11.Stress and anxiety.   PLAN:  I have increased David [name]'s lisinopril to 40 mg daily.  I have  added hydrochlorothiazide 25 mg daily for his hypertension.  I have  changed his Crestor to pravastatin 40 mg daily because he cannot afford  the Crestor.  I have encouraged him to follow 2-g or less sodium diet  daily.  I have  asked him to call if his blood pressure remains elevated.  I have encouraged increase in his exercise program and highly  recommended cardiac rehab to help reduce some of his stress and anxiety  and get him back on his feet again.  He sees Dr. Donata Clay next week and  will see Dr. Riley Kill back in 2 months.      Jacolyn Reedy, PA-C  Electronically Signed      Luis Abed, MD, [facility]  Electronically Signed   ML/MedQ  DD: 11/09/2007  DT: 11/09/2007  Job #: [id number]

## 2010-06-10 NOTE — Consult Note (Signed)
[name], [name] NO.:  [id number]   MEDICAL RECORD NO.:  [id number]          PATIENT TYPE:  INP   LOCATION:                               FACILITY:  [facility]   PHYSICIAN:  Kerin Perna, M.D.  DATE OF BIRTH:  Jun 14, 1945   DATE OF CONSULTATION:  10/15/2007  DATE OF DISCHARGE:                                 CONSULTATION   REQUESTING PHYSICIAN:  Arturo Morton. Riley Kill, MD, [facility]   REASON FOR CONSULTATION:  Severe multivessel coronary artery disease  with acute subendocardial MI and paroxysmal atrial fibrillation.   HISTORY OF PRESENT ILLNESS:  I was asked to evaluate this 65 year old  white male for possible surgical coronary revascularization with  combined maze procedure for recently diagnosed severe multivessel  coronary disease with an acute MI, mild-to-moderate reduction in LV  function, and occurrence of paroxysmal atrial fibrillation.  The patient  had sudden onset of chest and jaw pain associated with shortness of  breath on the day of admission, October 13, 2007.  The patient  presented to the [facility] ER where his blood pressure was elevated at  170/110, and he had heart rate 160 and rapid atrial fibrillation.  He  had associated chest pain and was started on IV Cardizem drip.  His  heart rate was reduced to low in 100s.  The patient denied any  palpitations during this time.  He has no prior history of cardiac  disease, arrhythmia, or other medical problems and does not have a  primary care physician.   ADMISSION DIAGNOSES:  1. Acute myocardial infarction by positive cardiac enzymes with a peak      CPK-MB of 50.2 ng/mL.  2. Paroxysmal atrial fibrillation.  3. Hypertension.   HOME MEDICATIONS:  None.   ALLERGIES:  None.   SURGICAL HISTORY:  Right knee replacement and partial colectomy for  diverticulitis over 30 years ago.   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  He smokes 1  pack of cigarettes a day (50-pack-years).   Occasional alcohol intake.   FAMILY HISTORY:  Positive for heart disease and cancer.   REVIEW OF SYSTEMS:  He has had multiple injuries to his extremities that  did not require surgery.  He denies any significant chest injuries.  He  denies any active dental problems or difficulty swallowing.  He is a  chronic smoker and has a chronic cough but no sputum production.  He has  had no recurrence of diverticulitis since his colectomy 30 years ago.  He has had claudication in his right leg but denies a DVT.  He denies  diabetes or thyroid disease.  He denies any prior TIA, stroke, or  seizure.  He denies any prior blood transfusion or bleeding disorder.  He is unsure of his cholesterol level.   PHYSICAL EXAMINATION:  VITAL SIGNS:  He is 5 feet 8.5 inches and weighs  165 pounds.  Blood pressure 140/70.  Pulse is currently in sinus rhythm,  rate 78 per minute.  GENERAL APPEARANCE:  That of a middle-aged white male in the CCU, in no  acute distress.  HEENT:  Normocephalic.  Dentition adequate.  NECK:  Without JVD, mass, or bruit.  LYMPHATICS:  No palpable cervical or supraclavicular adenopathy.  THORAX:  Without deformity, and breath sounds are clear.  CARDIAC:  Regular rhythm without S3 gallop or murmur.  Peripheral pulses  are 2+ in all extremities.  There is no clubbing or cyanosis or edema.  NEUROLOGIC:  Nonfocal.  ABDOMEN:  Soft without pulsatile mass.   LABORATORY DATA:  I reviewed his coronary arteriogram and his 2D echo.  He has severe disease of his right coronary and circumflex systems with  mild disease of the LAD and ramus branch of the left coronary.  His LVEF  is 50%.  His echo shows LVH with some 1-2+ mitral regurgitation although  this was difficult to evaluate due to the ectopy during the echo.  There  is no significant left atrial enlargement.   IMPRESSION AND PLAN:  The patient's coronary anatomy would be suited  optimally for surgical revascularization with combined  maze procedure.  A TE intraoperatively would assess his mitral valve, what appears to be  mild, although it is difficult to assess the severity of the mitral  regurgitation on the chest wall echo due to ectopy during the exam.  Surgical revascularization of the distal right coronary, the circumflex,  and possibly the left anterior descending - ramus should provide him  with long-term therapy for his significant coronary disease.  An  associated maze procedure would hopefully prevent the long-term Coumadin  requirement.  This was discussed with the patient, and I have further  reviewed the situation with his wife who is a Charity fundraiser.  Thank you for the  consultation.      Kerin Perna, M.D.  Electronically Signed     PV/MEDQ  D:  10/15/2007  T:  10/15/2007  Job:  [id number]

## 2010-06-10 NOTE — H&P (Signed)
NAMENICHOLUS, David [name]              ACCOUNT NO.:  [id number]   MEDICAL RECORD NO.:  [id number]          PATIENT TYPE:  INP   LOCATION:  1225                         FACILITY:  [facility]   PHYSICIAN:  Doylene Canning. Ladona Ridgel, MD    DATE OF BIRTH:  1945/12/31   DATE OF ADMISSION:  10/13/2007  DATE OF DISCHARGE:                              HISTORY & PHYSICAL   PRIMARY CARDIOLOGIST:  Will be new.  Doylene Canning. Ladona Ridgel, MD   PRIMARY CARE PHYSICIAN:  The patient does not have one.   HISTORY OF PRESENT ILLNESS:  This is a 65 year old Caucasian male with  no prior history of coronary artery disease, who awoke this a.m. with  sudden onset of complaints of shortness of breath and panting with  pain and pressure in his chest, jaw pain, shoulder pain, stomach pain,  and generalized achiness, which he described as flu-like symptoms.  He  waited about 30 minutes and did not feel better, so he asked his wife to  drive him to [employer].  Upon arrival to [facility], the patient  had a blood pressure of 170/110 and a heart rate of 163 beats per  minute, was found to be in AFib RVR.  The patient states that he felt as  if someone was sitting on his chest.  He was started on Cardizem drip,  and pain and pressure were relieved.  His heart rate is better  controlled now between 100 and 110, and the patient is more comfortable.  He could not tell that this heart rate was during this episode this  morning.  Otherwise, the patient had no prior symptoms and is now being  followed by a physician regularly.   REVIEW OF SYSTEMS:  Sudden onset of chest pain, shortness of breath,  also some symptoms of intermittent claudication on the right.  The  patient also had some mild nausea and diaphoresis.   PAST MEDICAL HISTORY:  None.   SOCIAL HISTORY:  He lives in [place] with his wife.  He is an  unemployed Music therapist.  He is a 49-pack-year smoker.  Occasional EtOH.  No drug use.   PAST SURGICAL HISTORY:  Right knee  replacement and he had a colonoscopy  for diverticulitis.   FAMILY HISTORY:  Mother died in her sleep.  Father deceased of cancer,  but also had a history of heart disease.  He has 2 sisters, who are in  good health.   CURRENT MEDICATIONS AT HOME:  None.   CURRENT ALLERGIES:  None.   LABORATORIES:  Hemoglobin 16.6, hematocrit 48.8, white blood cells 8.6,  and platelets 243.  CK 166, MB 9.7, and troponin less than 0.05.  PTT  28, PT 13.2, and INR 1.0.   Chest x-ray revealing mild cardiomegaly with no active lung disease.  EKG revealing atrial fibrillation with ventricular rate of 163 beats per  minute with ischemic changes noted with T-wave depression in V3 through  V6 and T-wave inversion inferiorly.   PHYSICAL EXAMINATION:  GENERAL:  He is awake, alert, and oriented, in no  acute distress at present.  HEENT:  Head is normocephalic and atraumatic.  Eyes, PERRLA.  Mucous  membranes of mouth are pink and moist.  Tongue is midline.  NECK:  Supple.  There is no JVD.  No carotid bruits appreciated.  CARDIOVASCULAR:  Irregular rhythm.  Tachycardic without murmurs, rubs,  or gallops.  Pulses are 2+ and equal without bruits.  LUNGS:  Have some bibasilar crackles with no cough.  ABDOMEN:  Soft and nontender, 2+ bowel sounds.  EXTREMITIES:  No clubbing, cyanosis, or edema.  NEUROLOGIC:  Cranial nerves II through XII are grossly intact.   IMPRESSION:  1. Atrial fibrillation without ventricular rate.  2. Mildly elevated CK-MB per point-of-care markers, probably related      to demand.  3. Hypertension.  4. Tobacco abuse.   PLAN:  This is a 65 year old Caucasian male with no prior cardiac  history who awoke with sudden onset of shortness of breath and panting  with mild diaphoresis and subsequent chest pressure radiated to jaw,  neck, and shoulders.  The patient was also found to be hypertensive on  arrival to the [facility] ER and in AFib and RVR on admission.  He was  started on  Cardizem bolus and now drip and heart rate is better  controlled.   We will admit this patient with multiple cardiovascular risk factors to  the ICU.  We will rule out MI and continue rate control, put him on low-  dose beta-blocker and heparin drip.  If cardiac enzymes are positive or  if he has recurrent chest pain, we will plan cardiac catheterization.  Otherwise, we will consider TEE cardioversion if necessary or he may  need to be on long-term Coumadin if he does not confirm and also do risk  stratification by checking lipids and LFTs.  We will add an aspirin and  check an echo along with TSH, and we will make further recommendations  based upon the patient's response to treatment and hospital course.      Bettey Mare. Lyman Bishop, NP  Electronically Signed     ______________________________  Doylene Canning. Ladona Ridgel, MD    KML/MEDQ  D:  10/13/2007  T:  10/14/2007  Job:  [id number]

## 2010-06-13 NOTE — Assessment & Plan Note (Signed)
[facility]                            CARDIOLOGY OFFICE NOTE   David [name]                     MRN:          [id number]  DATE:03/13/2008                            DOB:          January 13, 1946    [name] is in for followup.  He has been keeping a diary of his  blood pressures.  His home blood pressures have been under really quite  good control and they have slowly and gradually improved.  He is  exercising regularly without difficulty and denies any ongoing shortness  of breath.  He is not aware of having gone out of any kind of rhythm.  He is now seeing [facility] [place], specifically David [name].  He is back on Pravachol because he wanted generic statin.   MEDICATIONS:  1. Aspirin 81 mg daily.  2. Amlodipine 5 mg daily.  3. Pravastatin 40 mg daily.  4. Lisinopril HCT 20/25 daily.  5. Coreg 6.25 b.i.d.  6. Multivitamin.  7. Glucosamine.  8. Selenium.  9. Vitamin C.  10.Omega-3.   On physical, he is alert and oriented in no distress.  The blood  pressure today is 140/90, pulse 81.  The lung fields are clear.  Cardiac  rhythm is regular without a definite murmur noted.   Electrocardiogram demonstrates normal sinus rhythm with inferior MI  indeterminate age.   IMPRESSION:  1. Status post multivessel coronary artery bypass graft surgery with      mitral ring implantation.  2. Hypertension, improved control.  3. Hypercholesterolemia.  4. History of tobacco use.   PLAN:  1. Return to clinic in 4 months.  2. Continue to keep diary.  3. Repeat basic metabolic profile, lipid profile, and liver profile on      return in 4 months.  4. We discussed possibly doing a treadmill, but the patient has very      bad knees and does not think this is feasible.  We will encourage      him to remain active.     David Morton. Riley Kill, MD, [facility]  Electronically Signed    TDS/MedQ  DD: 03/13/2008  DT: 03/14/2008  Job #:  [id number]   cc:   David [name], M.D.  Kerin Perna, M.D.

## 2010-10-27 LAB — HEPARIN LEVEL (UNFRACTIONATED)
Heparin Unfractionated: 0.1 — ABNORMAL LOW
Heparin Unfractionated: 0.44
Heparin Unfractionated: 0.52
Heparin Unfractionated: 0.53
Heparin Unfractionated: 0.67

## 2010-10-27 LAB — CBC
HCT: 25.5 — ABNORMAL LOW
HCT: 25.5 — ABNORMAL LOW
HCT: 25.8 — ABNORMAL LOW
HCT: 28 — ABNORMAL LOW
HCT: 29.8 — ABNORMAL LOW
HCT: 38.1 — ABNORMAL LOW
HCT: 39.1
HCT: 40.6
HCT: 48.8
Hemoglobin: 10.2 — ABNORMAL LOW
Hemoglobin: 13.2
Hemoglobin: 13.8
Hemoglobin: 13.8
Hemoglobin: 13.8
Hemoglobin: 14.2
Hemoglobin: 8.7 — ABNORMAL LOW
Hemoglobin: 8.7 — ABNORMAL LOW
Hemoglobin: 8.8 — ABNORMAL LOW
Hemoglobin: 9.4 — ABNORMAL LOW
MCHC: 33.4
MCHC: 33.6
MCHC: 34
MCHC: 34
MCHC: 34.2
MCHC: 34.3
MCHC: 34.3
MCHC: 34.3
MCHC: 34.4
MCV: 89
MCV: 89.1
MCV: 89.5
MCV: 89.5
MCV: 89.9
MCV: 90
MCV: 90.2
MCV: 90.3
MCV: 90.3
MCV: 90.5
MCV: 90.6
MCV: 91.2
Platelets: 117 — ABNORMAL LOW
Platelets: 122 — ABNORMAL LOW
Platelets: 124 — ABNORMAL LOW
Platelets: 128 — ABNORMAL LOW
Platelets: 138 — ABNORMAL LOW
Platelets: 156
Platelets: 190
RBC: 2.83 — ABNORMAL LOW
RBC: 2.85 — ABNORMAL LOW
RBC: 2.85 — ABNORMAL LOW
RBC: 2.99 — ABNORMAL LOW
RBC: 3.07 — ABNORMAL LOW
RBC: 3.34 — ABNORMAL LOW
RBC: 4.33
RBC: 4.49
RBC: 4.54
RBC: 5.44
RDW: 13.5
RDW: 13.5
RDW: 13.6
RDW: 13.6
RDW: 13.7
RDW: 13.7
RDW: 13.8
RDW: 14.1
WBC: 10.7 — ABNORMAL HIGH
WBC: 11.5 — ABNORMAL HIGH
WBC: 11.6 — ABNORMAL HIGH
WBC: 11.8 — ABNORMAL HIGH
WBC: 12.2 — ABNORMAL HIGH
WBC: 7.2
WBC: 7.6
WBC: 7.7
WBC: 8.6
WBC: 8.6

## 2010-10-27 LAB — PREPARE PLATELET PHERESIS

## 2010-10-27 LAB — POCT I-STAT 3, VENOUS BLOOD GAS (G3P V)
Bicarbonate: 21.7
O2 Saturation: 88
TCO2: 23
pCO2, Ven: 38.8 — ABNORMAL LOW
pO2, Ven: 57 — ABNORMAL HIGH

## 2010-10-27 LAB — GLUCOSE, CAPILLARY
Glucose-Capillary: 103 — ABNORMAL HIGH
Glucose-Capillary: 109 — ABNORMAL HIGH
Glucose-Capillary: 110 — ABNORMAL HIGH
Glucose-Capillary: 115 — ABNORMAL HIGH
Glucose-Capillary: 117 — ABNORMAL HIGH
Glucose-Capillary: 123 — ABNORMAL HIGH
Glucose-Capillary: 123 — ABNORMAL HIGH
Glucose-Capillary: 124 — ABNORMAL HIGH
Glucose-Capillary: 124 — ABNORMAL HIGH
Glucose-Capillary: 148 — ABNORMAL HIGH
Glucose-Capillary: 96
Glucose-Capillary: 99
Glucose-Capillary: 99

## 2010-10-27 LAB — CROSSMATCH: Antibody Screen: NEGATIVE

## 2010-10-27 LAB — COMPREHENSIVE METABOLIC PANEL
ALT: 23
Albumin: 3.2 — ABNORMAL LOW
Alkaline Phosphatase: 74
BUN: 13
CO2: 26
Calcium: 9.9
Chloride: 103
Creatinine, Ser: 0.98
GFR calc non Af Amer: >60
Glucose, Bld: 99
Potassium: 3.7
Potassium: 4.6
Sodium: 138
Sodium: 143
Total Bilirubin: 0.8
Total Protein: 5.8 — ABNORMAL LOW
Total Protein: 7

## 2010-10-27 LAB — DIFFERENTIAL
Basophils Absolute: 0.1
Basophils Relative: 1
Eosinophils Absolute: 0.2
Eosinophils Relative: 3
Eosinophils Relative: 3
Lymphocytes Relative: 30
Lymphocytes Relative: 31
Lymphs Abs: 2.6
Lymphs Abs: 2.6
Monocytes Absolute: 0.7
Monocytes Relative: 8
Monocytes Relative: 9
Neutro Abs: 4.9

## 2010-10-27 LAB — URINALYSIS, ROUTINE W REFLEX MICROSCOPIC
Bilirubin Urine: NEGATIVE
Glucose, UA: NEGATIVE
Hgb urine dipstick: NEGATIVE
Ketones, ur: NEGATIVE
Specific Gravity, Urine: 1.017
Urobilinogen, UA: 0.2
Urobilinogen, UA: 0.2
pH: 5.5
pH: 7

## 2010-10-27 LAB — BASIC METABOLIC PANEL
BUN: 13
BUN: 16
BUN: 21
BUN: 25 — ABNORMAL HIGH
BUN: 29 — ABNORMAL HIGH
BUN: 30 — ABNORMAL HIGH
CO2: 24
CO2: 25
CO2: 26
CO2: 26
CO2: 27
CO2: 29
CO2: 29
CO2: 30
Calcium: 7.8 — ABNORMAL LOW
Calcium: 8 — ABNORMAL LOW
Calcium: 8.3 — ABNORMAL LOW
Calcium: 8.4
Calcium: 8.6
Calcium: 9.1
Calcium: 9.1
Chloride: 101
Chloride: 101
Chloride: 102
Chloride: 102
Chloride: 106
Chloride: 108
Chloride: 108
Chloride: 98
Creatinine, Ser: 1.07
Creatinine, Ser: 1.22
Creatinine, Ser: 1.35
Creatinine, Ser: 1.38
Creatinine, Ser: 1.41
Creatinine, Ser: 1.48
Creatinine, Ser: 1.68 — ABNORMAL HIGH
GFR calc Af Amer: 50 — ABNORMAL LOW
GFR calc Af Amer: 58 — ABNORMAL LOW
GFR calc Af Amer: >60
GFR calc Af Amer: >60
GFR calc Af Amer: >60
GFR calc Af Amer: >60
GFR calc Af Amer: >60
GFR calc Af Amer: >60
GFR calc non Af Amer: 48 — ABNORMAL LOW
GFR calc non Af Amer: 51 — ABNORMAL LOW
GFR calc non Af Amer: 52 — ABNORMAL LOW
GFR calc non Af Amer: 53 — ABNORMAL LOW
GFR calc non Af Amer: >60
GFR calc non Af Amer: >60
Glucose, Bld: 107 — ABNORMAL HIGH
Glucose, Bld: 107 — ABNORMAL HIGH
Glucose, Bld: 107 — ABNORMAL HIGH
Glucose, Bld: 117 — ABNORMAL HIGH
Glucose, Bld: 155 — ABNORMAL HIGH
Glucose, Bld: 96
Glucose, Bld: 96
Potassium: 3.4 — ABNORMAL LOW
Potassium: 3.6
Potassium: 3.7
Potassium: 3.8
Potassium: 4
Potassium: 4
Potassium: 4.1
Potassium: 4.3
Sodium: 135
Sodium: 135
Sodium: 137
Sodium: 137
Sodium: 137
Sodium: 138
Sodium: 141
Sodium: 142
Sodium: 143

## 2010-10-27 LAB — PREPARE FRESH FROZEN PLASMA

## 2010-10-27 LAB — POCT I-STAT, CHEM 8
Calcium, Ion: 1.13
Chloride: 106
Creatinine, Ser: 1.2
Creatinine, Ser: 1.2
Glucose, Bld: 135 — ABNORMAL HIGH
Glucose, Bld: 149 — ABNORMAL HIGH
Hemoglobin: 9.5 — ABNORMAL LOW
Potassium: 4.8
Sodium: 137
TCO2: 23

## 2010-10-27 LAB — URINE MICROSCOPIC-ADD ON

## 2010-10-27 LAB — POCT I-STAT 4, (NA,K, GLUC, HGB,HCT)
Glucose, Bld: 107 — ABNORMAL HIGH
Glucose, Bld: 111 — ABNORMAL HIGH
Glucose, Bld: 92
Glucose, Bld: 94
HCT: 22 — ABNORMAL LOW
HCT: 24 — ABNORMAL LOW
HCT: 27 — ABNORMAL LOW
HCT: 37 — ABNORMAL LOW
Hemoglobin: 11.2 — ABNORMAL LOW
Hemoglobin: 7.5 — CL
Hemoglobin: 7.5 — CL
Hemoglobin: 8.2 — ABNORMAL LOW
Potassium: 4.4
Potassium: 4.6
Potassium: 5.3 — ABNORMAL HIGH
Sodium: 135
Sodium: 136

## 2010-10-27 LAB — POCT I-STAT 3, ART BLOOD GAS (G3+)
Acid-base deficit: 1
Acid-base deficit: 2
Acid-base deficit: 3 — ABNORMAL HIGH
Bicarbonate: 21.7
Bicarbonate: 23
Bicarbonate: 24.2 — ABNORMAL HIGH
O2 Saturation: 90
O2 Saturation: 93
O2 Saturation: 95
O2 Saturation: 99
Patient temperature: 36.3
TCO2: 23
pCO2 arterial: 37.4
pO2, Arterial: 135 — ABNORMAL HIGH
pO2, Arterial: 354 — ABNORMAL HIGH
pO2, Arterial: 55 — ABNORMAL LOW
pO2, Arterial: 71 — ABNORMAL LOW
pO2, Arterial: 77 — ABNORMAL LOW

## 2010-10-27 LAB — PROTIME-INR
INR: 1.1
INR: 1.2
INR: 1.2
INR: 1.4
INR: 1.5
INR: 2.8 — ABNORMAL HIGH
Prothrombin Time: 14.4
Prothrombin Time: 14.9
Prothrombin Time: 15.5 — ABNORMAL HIGH
Prothrombin Time: 15.7 — ABNORMAL HIGH
Prothrombin Time: 17.6 — ABNORMAL HIGH
Prothrombin Time: 18.3 — ABNORMAL HIGH
Prothrombin Time: 31.8 — ABNORMAL HIGH

## 2010-10-27 LAB — BLOOD GAS, ARTERIAL
Acid-base deficit: 0.2
O2 Content: 21
O2 Saturation: 95.8
Patient temperature: 98.5
pO2, Arterial: 73.5 — ABNORMAL LOW

## 2010-10-27 LAB — MAGNESIUM
Magnesium: 2.5
Magnesium: 2.5
Magnesium: 3.1 — ABNORMAL HIGH

## 2010-10-27 LAB — LIPID PANEL
Cholesterol: 215 — ABNORMAL HIGH
HDL: 32 — ABNORMAL LOW
LDL Cholesterol: 169 — ABNORMAL HIGH
Total CHOL/HDL Ratio: 6.7
Triglycerides: 71

## 2010-10-27 LAB — HEMATOCRIT: HCT: 23.5 — ABNORMAL LOW

## 2010-10-27 LAB — APTT: aPTT: 36

## 2010-10-27 LAB — CARDIAC PANEL(CRET KIN+CKTOT+MB+TROPI)
CK, MB: 31.7 — ABNORMAL HIGH
Total CK: 432 — ABNORMAL HIGH
Total CK: 480 — ABNORMAL HIGH
Troponin I: 2.72

## 2010-10-27 LAB — CK TOTAL AND CKMB (NOT AT ARMC): CK, MB: 17.9 — ABNORMAL HIGH

## 2010-10-27 LAB — CREATININE, SERUM
Creatinine, Ser: 1.12
Creatinine, Ser: 1.14
GFR calc Af Amer: >60
GFR calc Af Amer: >60
GFR calc non Af Amer: >60
GFR calc non Af Amer: >60

## 2010-10-27 LAB — HEMOGLOBIN A1C: Hgb A1c MFr Bld: 5.4

## 2010-10-27 LAB — POCT I-STAT GLUCOSE
Glucose, Bld: 95
Operator id: 3342

## 2010-10-27 LAB — TSH: TSH: 1.019

## 2010-10-28 LAB — PROTIME-INR
INR: 2.8 — ABNORMAL HIGH
Prothrombin Time: 31.6 — ABNORMAL HIGH

## 2011-05-18 IMAGING — CR DG CHEST 2V
2 series · 2 of 2 positions shown · non-contrast
Comparison: 11/18/2007.

CLINICAL DATA: Pre catheterization.

CHEST - 2 VIEW

[view not recorded (1 of 2)]
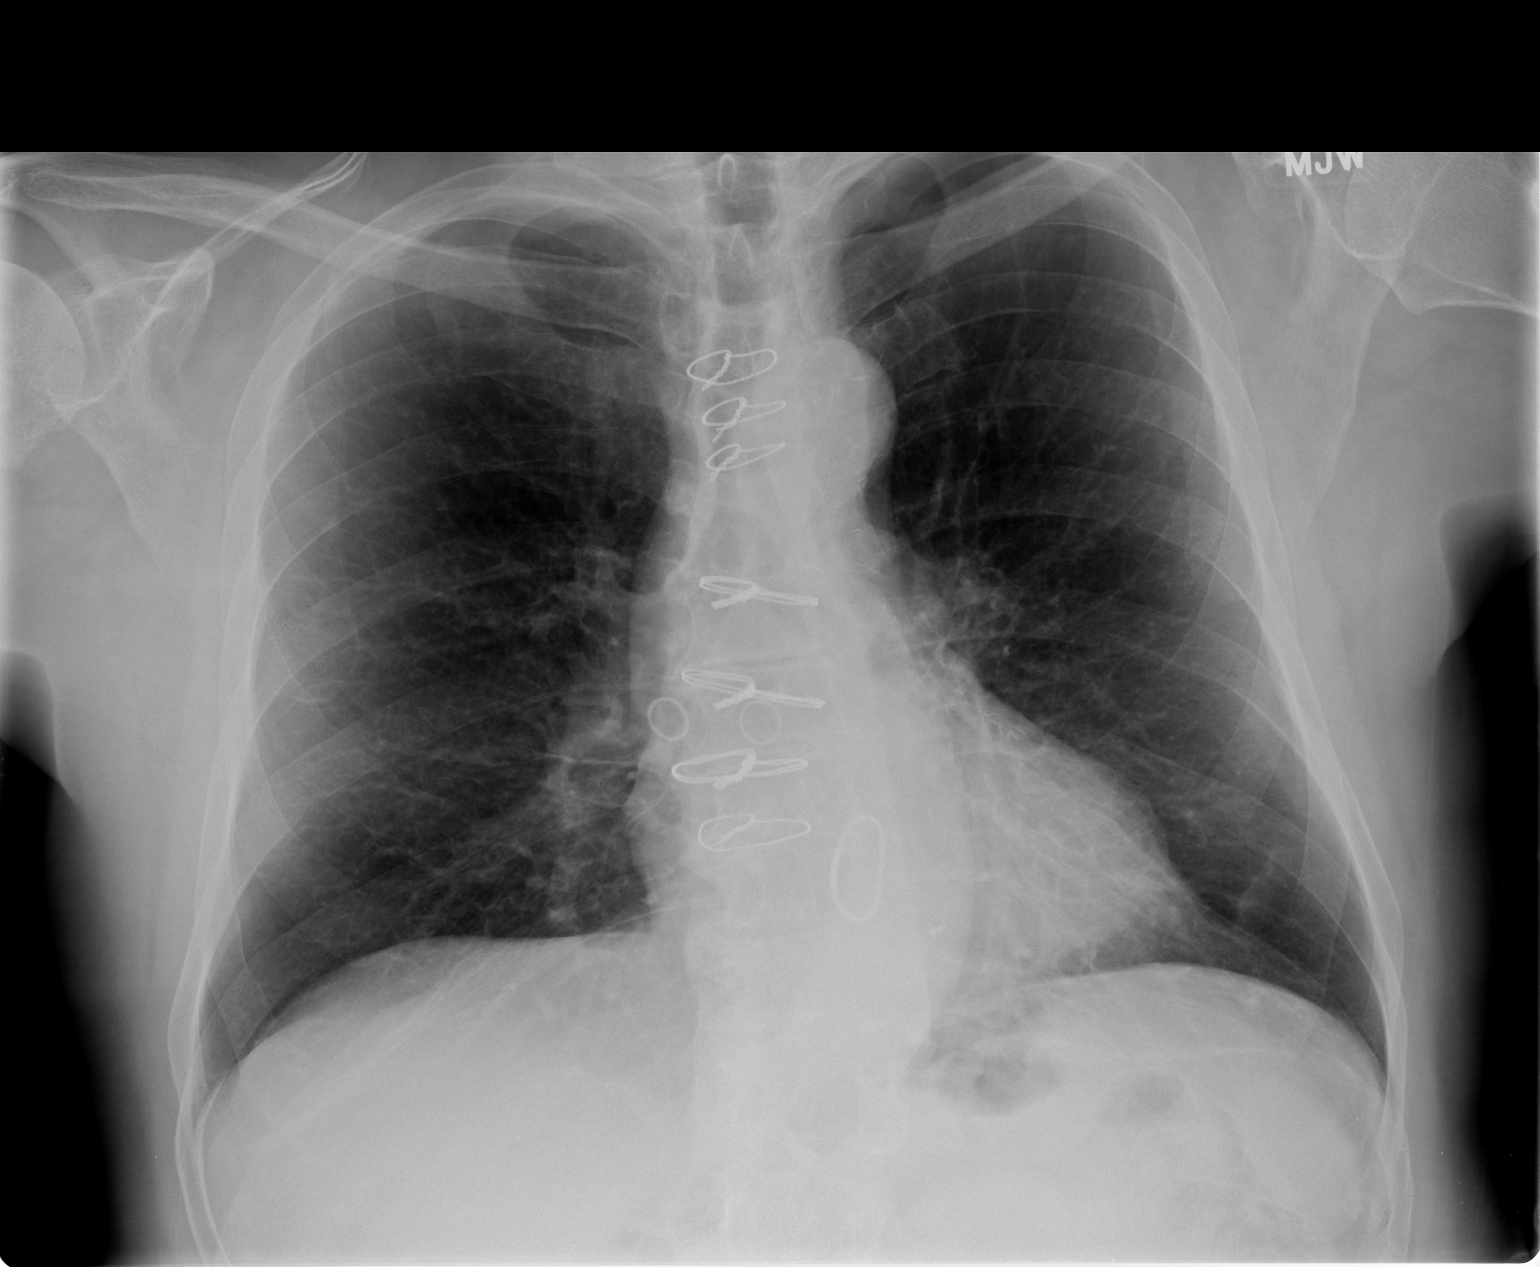

[view not recorded (2 of 2)]
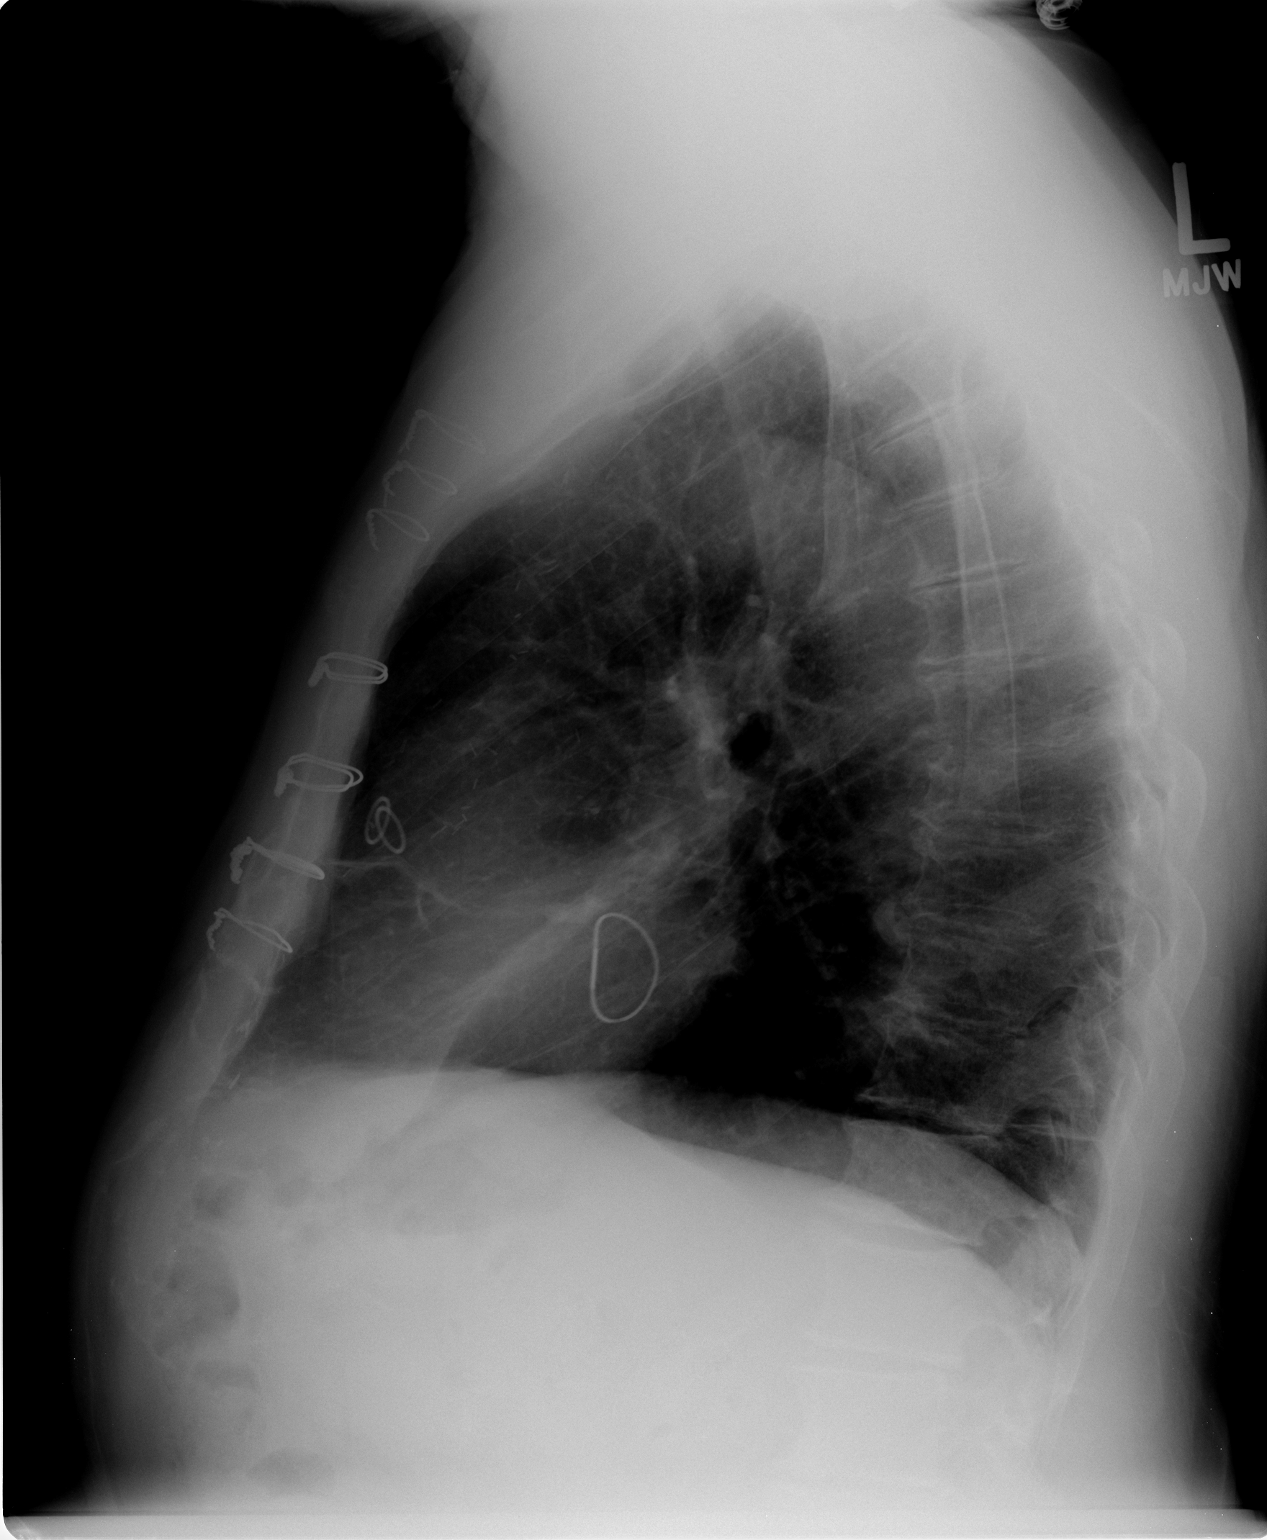

[2 of 2 positions shown; findings below may reference images not displayed]

FINDINGS: The heart size and mediastinal contours are stable status
post CABG and mitral valve replacement.  There is stable scarring
in the lingula.  Overall basilar aeration has improved.  There is
no residual pleural effusion or edema.  No acute osseous findings
are identified.
IMPRESSION: Interval resolution of left pleural effusion and basilar
atelectasis.  No acute cardiopulmonary process.

## 2020-05-29 ENCOUNTER — Encounter (HOSPITAL_BASED_OUTPATIENT_CLINIC_OR_DEPARTMENT_OTHER): Payer: Self-pay | Admitting: Emergency Medicine

## 2020-05-29 ENCOUNTER — Other Ambulatory Visit: Payer: Self-pay

## 2020-05-29 ENCOUNTER — Emergency Department (HOSPITAL_BASED_OUTPATIENT_CLINIC_OR_DEPARTMENT_OTHER)
Admission: EM | Admit: 2020-05-29 | Discharge: 2020-05-29 | Disposition: A | Discharge status: Home / Self Care | Payer: Medicare HMO | Attending: Emergency Medicine | Admitting: Emergency Medicine

## 2020-05-29 DIAGNOSIS — R339 Retention of urine, unspecified: Secondary | ICD-10-CM | POA: Diagnosis present

## 2020-05-29 DIAGNOSIS — I2510 Atherosclerotic heart disease of native coronary artery without angina pectoris: Secondary | ICD-10-CM | POA: Diagnosis not present

## 2020-05-29 DIAGNOSIS — R140 Abdominal distension (gaseous): Secondary | ICD-10-CM | POA: Insufficient documentation

## 2020-05-29 DIAGNOSIS — R600 Localized edema: Secondary | ICD-10-CM | POA: Insufficient documentation

## 2020-05-29 DIAGNOSIS — I10 Essential (primary) hypertension: Secondary | ICD-10-CM | POA: Diagnosis not present

## 2020-05-29 LAB — URINALYSIS, ROUTINE W REFLEX MICROSCOPIC
Bilirubin Urine: NEGATIVE
Glucose, UA: NEGATIVE mg/dL
Hgb urine dipstick: NEGATIVE
Ketones, ur: NEGATIVE mg/dL
Nitrite: NEGATIVE
Specific Gravity, Urine: 1.017 (ref 1.005–1.030)
pH: 6 (ref 5.0–8.0)

## 2020-05-29 NOTE — ED Notes (Signed)
Pt bladder scanned. Result was >999. Informed Dr. Donnald Garre.

## 2020-05-29 NOTE — Discharge Instructions (Signed)
1.  Call your family doctor tomorrow morning to discuss referral to urology.  Ideally you would get a repeat evaluation with urology in 3 to 7 days. 2.  You can do many of your usual activities with the catheter in place.  Avoid anything that is causing any kind of trauma or pulling on the catheter.

## 2020-05-29 NOTE — ED Provider Notes (Signed)
[facility] EMERGENCY DEPT Provider Note   CSN: [id number] Arrival date & time: 05/29/20  1655     History Chief Complaint  Patient presents with  . Urinary Retention    David [name] is a 75 y.o. male.  HPI Patient was sent to the emergency department by his primary care doctor to get a Foley catheter placed.  Patient reports that he has had decreased urine output for 2 days.  He reports he is putting out small amounts of urine but his abdomen has become very distended and firm.  Patient denies has been in any significant amount of pain.  Reports yesterday it was kind of uncomfortable but he has not been experiencing severe pain.  No fevers no chills.  No back pain.  No nausea no vomiting.  Patient reports he had a urinary tract infection once a long time ago but is never had urinary retention.     History reviewed. No pertinent past medical history.  Patient Active Problem List   Diagnosis Date Noted  . HYPERLIPIDEMIA 12/07/2007  . HYPERTENSION, BENIGN ESSENTIAL 12/07/2007  . C A D 12/07/2007    History reviewed. No pertinent surgical history.     History reviewed. No pertinent family history.     Home Medications Prior to Admission medications   Not on File    Allergies    Patient has no known allergies.  Review of Systems   Review of Systems 10 systems reviewed and negative except as per HPI Physical Exam Updated Vital Signs BP (!) 165/92 (BP Location: Right Arm)   Pulse 76   Temp 98.2 F (36.8 C) (Oral)   Resp 18   Ht 5' 6.5" (1.689 m)   Wt 84.8 kg   SpO2 98%   BMI 29.73 kg/m   Physical Exam Constitutional:      Appearance: He is well-developed.  HENT:     Head: Normocephalic and atraumatic.     Mouth/Throat:     Pharynx: Oropharynx is clear.  Eyes:     Extraocular Movements: Extraocular movements intact.  Cardiovascular:     Rate and Rhythm: Normal rate and regular rhythm.     Heart sounds: Normal heart sounds.  Pulmonary:      Effort: Pulmonary effort is normal.     Breath sounds: Normal breath sounds.  Abdominal:     General: Bowel sounds are normal. There is distension.     Palpations: Abdomen is soft.     Tenderness: There is no abdominal tenderness.  Genitourinary:    Penis: Normal.   Musculoskeletal:        General: Normal range of motion.     Cervical back: Neck supple.     Right lower leg: Edema present.     Left lower leg: Edema present.     Comments: Patient has about 1+ edema bilateral lower legs.  Skin:    General: Skin is warm and dry.  Neurological:     General: No focal deficit present.     Mental Status: He is alert and oriented to person, place, and time.     GCS: GCS eye subscore is 4. GCS verbal subscore is 5. GCS motor subscore is 6.     Coordination: Coordination normal.  Psychiatric:        Mood and Affect: Mood normal.     ED Results / Procedures / Treatments   Labs (all labs ordered are listed, but only abnormal results are displayed) Labs Reviewed  URINALYSIS, ROUTINE  W REFLEX MICROSCOPIC - Abnormal; Notable for the following components:      Result Value   Protein, ur TRACE (*)    Leukocytes,Ua TRACE (*)    All other components within normal limits    EKG None  Radiology No results found.  Procedures Procedures   Medications Ordered in ED Medications - No data to display  ED Course  I have reviewed the triage vital signs and the nursing notes.  Pertinent labs & imaging results that were available during my care of the patient were reviewed by me and considered in my medical decision making (see chart for details).    MDM Rules/Calculators/A&P                          Patient presents as outlined.  He said decreased urine for 2 days.  He has minimal pain.  No fever no nausea no vomiting.  Urinalysis is clear without signs of infection.  Post Foley placement and decompression, the abdomen is soft and nontender.  Patient did not have any pain with  decompression of the bladder.  Patient will follow-up with his PCP and seek referral to urology in [place].  He was also advised the possibility of following up with [facility].  Foley catheter care and follow-up plan reviewed. Final Clinical Impression(s) / ED Diagnoses Final diagnoses:  Urinary retention    Rx / DC Orders ED Discharge Orders    None       Arby Barrette, MD 05/29/20 2013

## 2020-05-29 NOTE — ED Notes (Signed)
Pt was given a leg bag, instructions were given and shown on how to use the leg bag.

## 2020-05-29 NOTE — ED Triage Notes (Signed)
Pt arrives to ED with c/o bladder outlet obstruction. Pt states he had lower abd pain that had led to x2 days of oliguria. Pt with urinary urgency.
